# Patient Record
Sex: Female | Born: 1995 | Race: White | Hispanic: No | Smoking: Never smoker
Health system: Southern US, Community
[De-identification: ages and names within clinical notes are randomized; demographics above are authoritative.]

## PROBLEM LIST (undated history)

## (undated) DIAGNOSIS — F909 Attention-deficit hyperactivity disorder, unspecified type: Secondary | ICD-10-CM

## (undated) HISTORY — DX: Attention-deficit hyperactivity disorder, unspecified type: F90.9

---
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
## 2017-06-05 DIAGNOSIS — Z30017 Encounter for initial prescription of implantable subdermal contraceptive: Secondary | ICD-10-CM | POA: Diagnosis not present

## 2017-06-22 ENCOUNTER — Encounter: Payer: Self-pay | Admitting: Internal Medicine

## 2017-06-22 ENCOUNTER — Ambulatory Visit (INDEPENDENT_AMBULATORY_CARE_PROVIDER_SITE_OTHER): Payer: BLUE CROSS/BLUE SHIELD | Admitting: Internal Medicine

## 2017-06-22 VITALS — BP 94/68 | HR 60 | Temp 97.8°F | Wt 101.0 lb

## 2017-06-22 DIAGNOSIS — R197 Diarrhea, unspecified: Secondary | ICD-10-CM | POA: Diagnosis not present

## 2017-06-22 DIAGNOSIS — J029 Acute pharyngitis, unspecified: Secondary | ICD-10-CM | POA: Diagnosis not present

## 2017-06-22 DIAGNOSIS — J039 Acute tonsillitis, unspecified: Secondary | ICD-10-CM | POA: Diagnosis not present

## 2017-06-22 LAB — POCT RAPID STREP A (OFFICE): RAPID STREP A SCREEN: NEGATIVE

## 2017-06-22 MED ORDER — AZITHROMYCIN 250 MG PO TABS: ORAL_TABLET | ORAL | 0 refills | Status: DC

## 2017-06-22 NOTE — Progress Notes (Signed)
   Subjective:    Patient ID: Shelby SheriffHannah Watkins, female    DOB: 1996-02-22, 21 y.o.   MRN: [id number]  HPI  21 year old Female in with sore throat for 3 days. No documented fever. Has also had diarrhea for a few days.Sore throat is right sided. Has malaise and fatigue.  Continues to work at [employer]    Review of Systems see above     Objective:   Physical Exam She has a pustule on her right tonsil which is enlarged and slightly red. Left tonsil looks okay. No adenopathy. TMs are clear bilaterally. Neck is supple without adenopathy. Chest clear to auscultation.       Assessment & Plan:  Acute pharyngitis  Diarrhea-etiology unclear-could be viral  Plan: Rapid strep screen is negative. Treated with Zithromax to by mouth day 1 followed by 1 by mouth days 2 through 5. With regard to diarrhea, she is to stay on clear liquids until diarrhea has resolved for 24 hours then advance diet slowly.

## 2017-06-22 NOTE — Patient Instructions (Signed)
Take Zithromax Z-PAK as directed. Clear liquids until diarrhea has resolved for 24 hours then advance diet slowly. Tylenol if needed for pain

## 2017-07-29 DIAGNOSIS — H52223 Regular astigmatism, bilateral: Secondary | ICD-10-CM | POA: Diagnosis not present

## 2018-01-11 ENCOUNTER — Other Ambulatory Visit: Payer: Self-pay | Admitting: Internal Medicine

## 2018-01-11 DIAGNOSIS — R634 Abnormal weight loss: Secondary | ICD-10-CM

## 2018-01-13 ENCOUNTER — Other Ambulatory Visit (INDEPENDENT_AMBULATORY_CARE_PROVIDER_SITE_OTHER): Payer: BLUE CROSS/BLUE SHIELD | Admitting: Internal Medicine

## 2018-01-13 DIAGNOSIS — Z1329 Encounter for screening for other suspected endocrine disorder: Secondary | ICD-10-CM | POA: Diagnosis not present

## 2018-01-13 DIAGNOSIS — E559 Vitamin D deficiency, unspecified: Secondary | ICD-10-CM | POA: Diagnosis not present

## 2018-01-13 DIAGNOSIS — R634 Abnormal weight loss: Secondary | ICD-10-CM | POA: Diagnosis not present

## 2018-01-13 DIAGNOSIS — Z13220 Encounter for screening for lipoid disorders: Secondary | ICD-10-CM | POA: Diagnosis not present

## 2018-01-13 DIAGNOSIS — Z0000 Encounter for general adult medical examination without abnormal findings: Secondary | ICD-10-CM | POA: Diagnosis not present

## 2018-01-14 LAB — CBC WITH DIFFERENTIAL/PLATELET
BASOS PCT: 0.5 %
Basophils Absolute: 21 cells/uL (ref 0–200)
Eosinophils Absolute: 111 cells/uL (ref 15–500)
Eosinophils Relative: 2.7 %
HEMATOCRIT: 40.4 % (ref 35.0–45.0)
Hemoglobin: 13.5 g/dL (ref 11.7–15.5)
LYMPHS ABS: 1882 {cells}/uL (ref 850–3900)
MCH: 29.8 pg (ref 27.0–33.0)
MCHC: 33.4 g/dL (ref 32.0–36.0)
MCV: 89.2 fL (ref 80.0–100.0)
MPV: 10.3 fL (ref 7.5–12.5)
Monocytes Relative: 9.7 %
NEUTROS PCT: 41.2 %
Neutro Abs: 1689 cells/uL (ref 1500–7800)
Platelets: 280 10*3/uL (ref 140–400)
RBC: 4.53 10*6/uL (ref 3.80–5.10)
RDW: 12.3 % (ref 11.0–15.0)
Total Lymphocyte: 45.9 %
WBC: 4.1 10*3/uL (ref 3.8–10.8)
WBCMIX: 398 {cells}/uL (ref 200–950)

## 2018-01-14 LAB — TSH: TSH: 1.32 mIU/L

## 2018-01-14 LAB — LIPID PANEL
CHOL/HDL RATIO: 2.2 (calc) (ref <5.0)
Cholesterol: 132 mg/dL (ref <200)
HDL: 59 mg/dL (ref >50)
LDL CHOLESTEROL (CALC): 58 mg/dL
NON-HDL CHOLESTEROL (CALC): 73 mg/dL (ref <130)
Triglycerides: 70 mg/dL (ref <150)

## 2018-01-14 LAB — VITAMIN D 25 HYDROXY (VIT D DEFICIENCY, FRACTURES): Vit D, 25-Hydroxy: 26 ng/mL — ABNORMAL LOW (ref 30–100)

## 2018-03-03 IMAGING — XA DG FLUORO GUIDE NDL PLC/BX
2 series · 2 of 2 positions shown · non-contrast
Comparison: none

CLINICAL DATA: Right shoulder pain.

[Series 1: ortho adipose · 1 of 1 slices shown (1 of 2)]
[im 1/1]
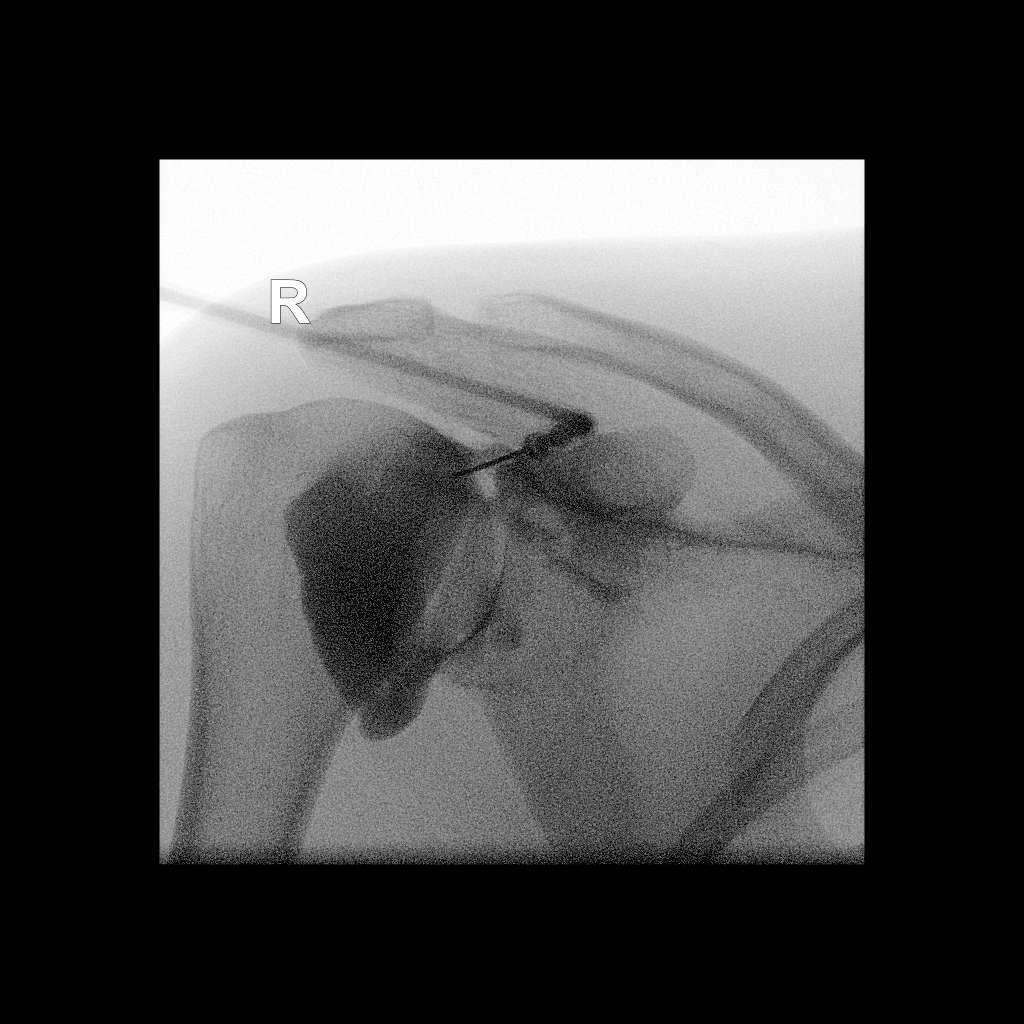

[Series 2: ortho adipose · 1 of 1 slices shown (2 of 2)]
[im 1/1]
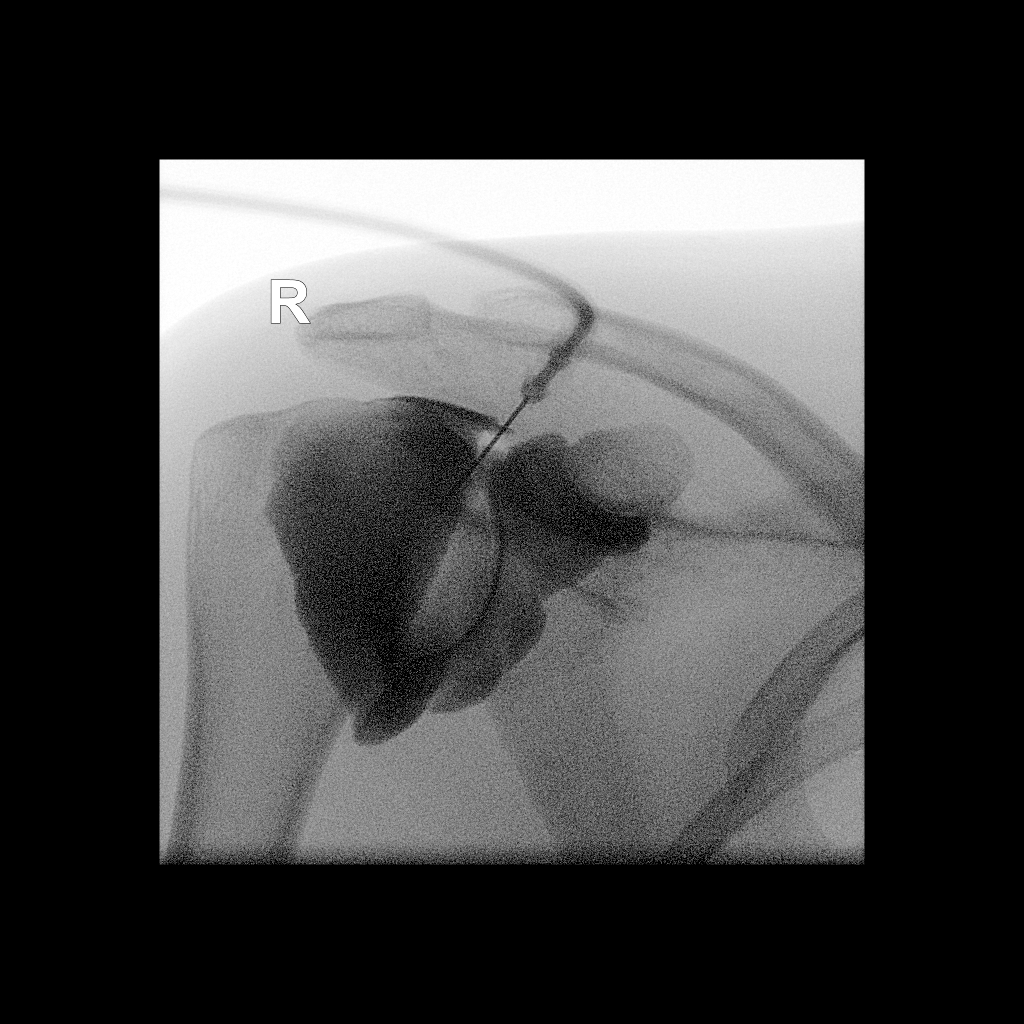

[2 of 2 positions shown; findings below may reference images not displayed]

FLUOROSCOPY TIME:  Radiation Exposure Index (as provided by the
fluoroscopic device): 3.48 microGray*m^2

Fluoroscopy Time (in minutes and seconds):  12 seconds

PROCEDURE:
RIGHT SHOULDER INJECTION UNDER FLUOROSCOPY

An appropriate skin entrance site was determined. The site was
marked, prepped with Betadine, draped in the usual sterile fashion,
and infiltrated locally with buffered Lidocaine. A 1.5 inch 22 gauge
spinal needle was advanced to the superomedial margin of the humeral
head under intermittent fluoroscopy. 1 ml of Lidocaine injected
easily. A mixture of 0.05 mL of MultiHance, 5 mL of 1% lidocaine,
and 15 mL of Isovue-M 200 was then used to opacify the right
shoulder capsule. 12 mL total of this mixture was injected. No
immediate complication.
IMPRESSION: Technically successful right shoulder injection for MRI.

## 2018-03-03 IMAGING — MR MR SHOULDER*R* W/CM
4 of 6 series · 18 of 40 positions shown · IV contrast (agent unspecified)
Comparison: None.

CLINICAL DATA: Right shoulder popping when rotating. Radiates down
the entire right arm.

EXAM:
MR ARTHROGRAM OF THE RIGHT SHOULDER
TECHNIQUE: Multiplanar, multisequence MR imaging of the right shoulder was
performed following the administration of intra-articular contrast.
CONTRAST:  See Injection Documentation.

[Series 3: T1 fat-sat · axial · 4.0mm · 0.22mm/px · z∈[-5,+73]mm · 5 of 22 slices shown (1 of 2)]
[im 1/22]
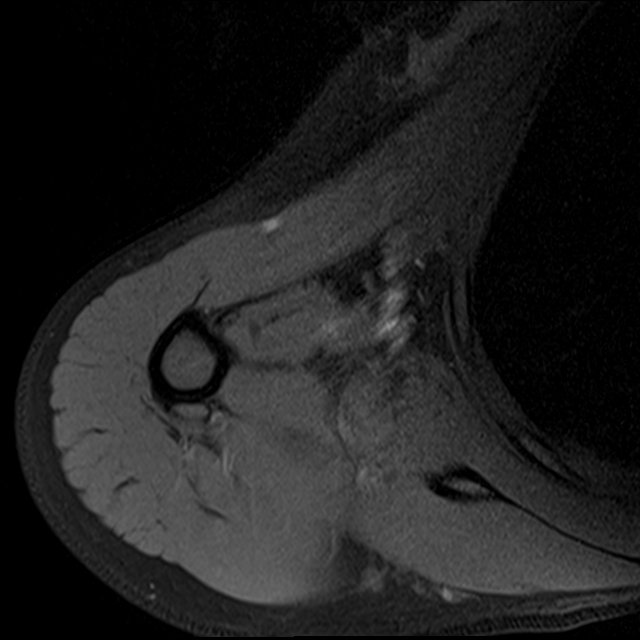
[im 3/22]
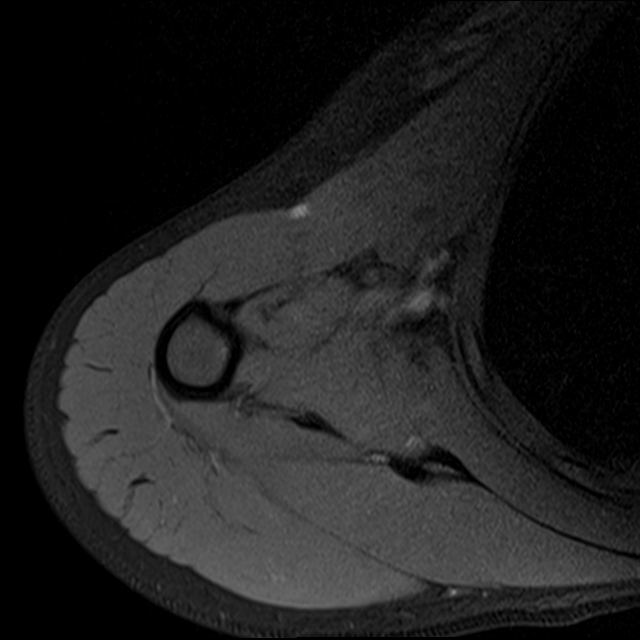
[im 6/22]
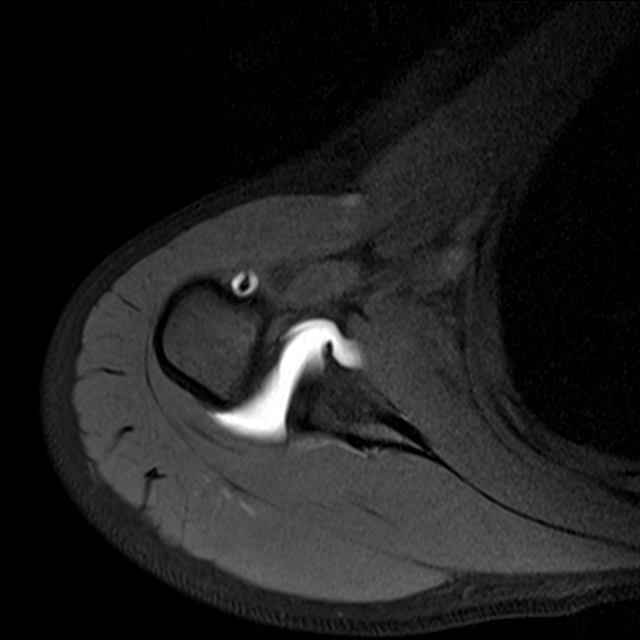
[im 11/22]
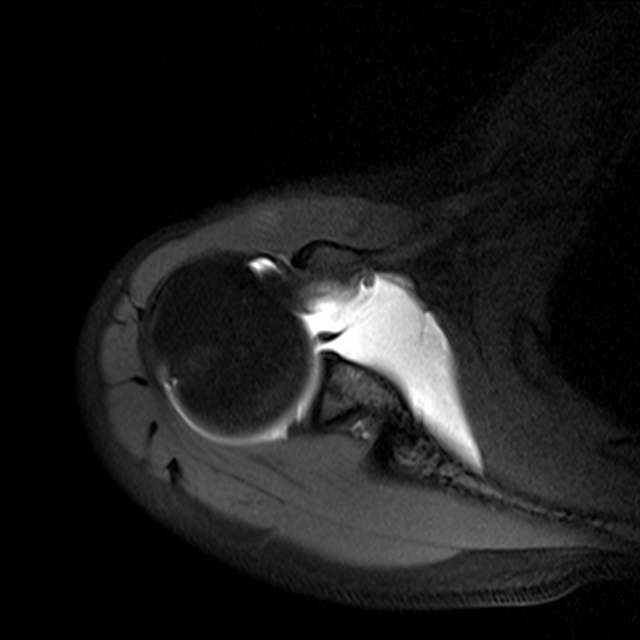
[im 19/22]
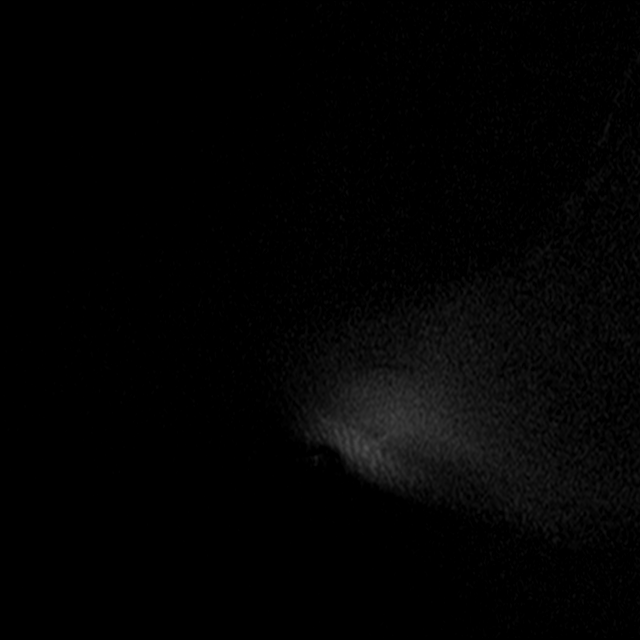

[Series 4: T1 fat-sat · oblique · 4.0mm · 0.22mm/px · 3 of 15 slices shown (2 of 2)]
[im 3/15]
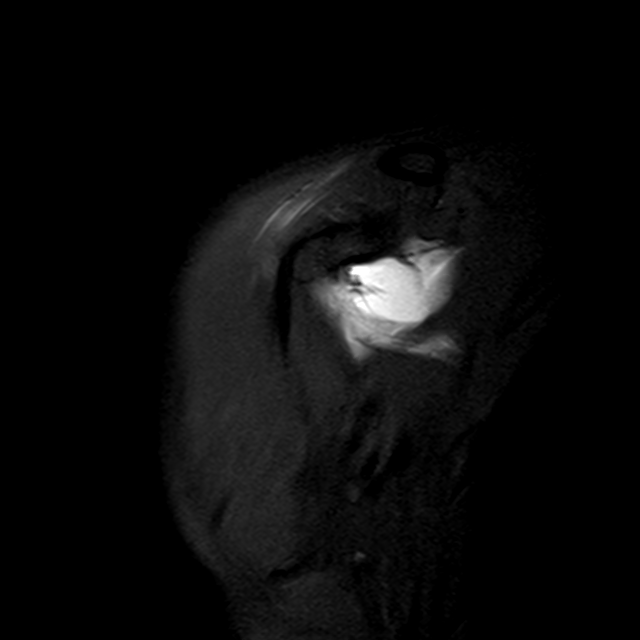
[im 9/15]
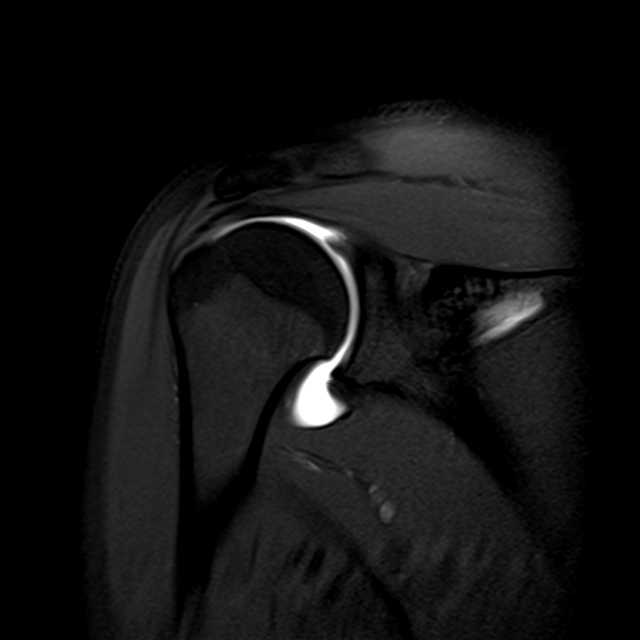
[im 15/15]
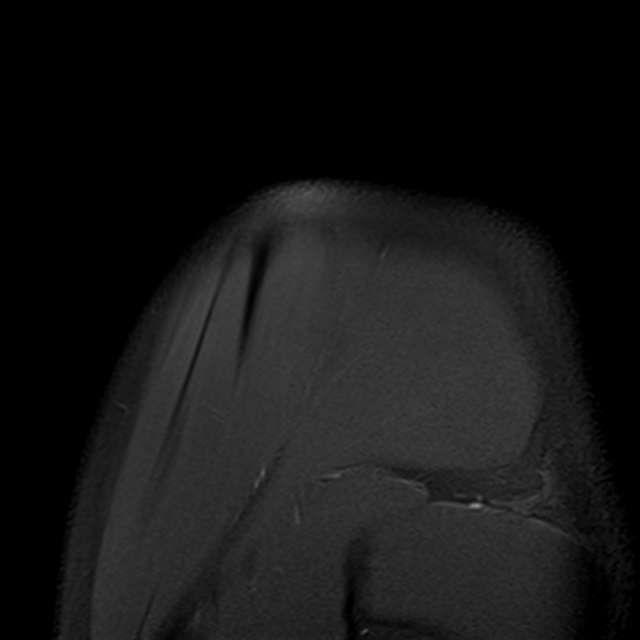

[Series 5: T1 · oblique · 4.0mm · 0.22mm/px · 3 of 15 slices shown]
[im 3/15]
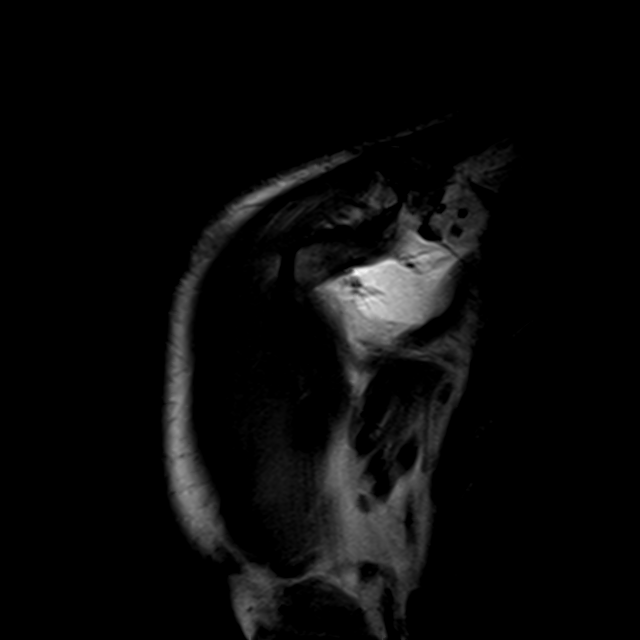
[im 9/15]
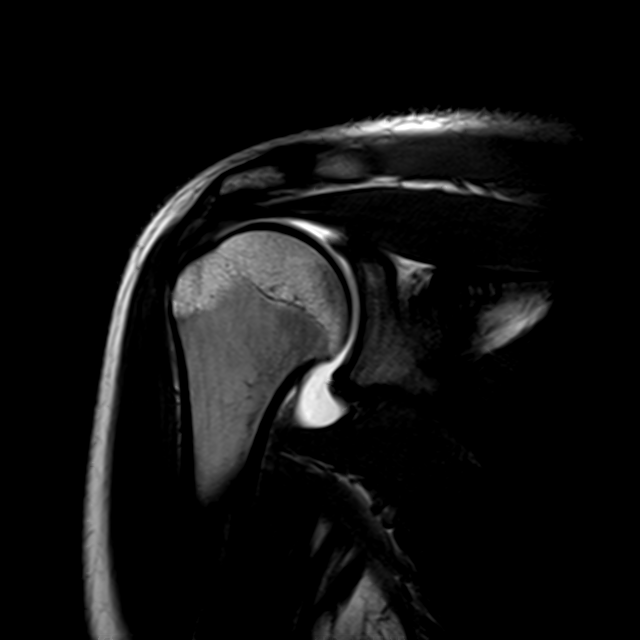
[im 15/15]
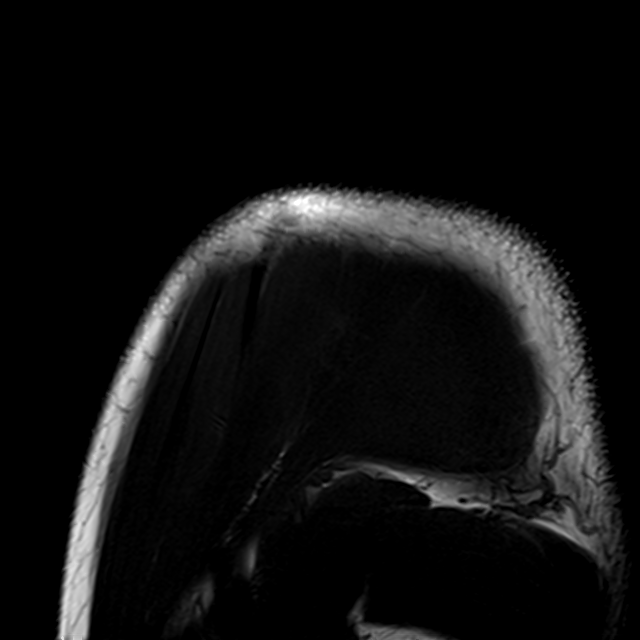

[Series 6: T2 fat-sat · oblique · 4.0mm · 0.44mm/px · 7 of 18 slices shown]
[im 1/18]
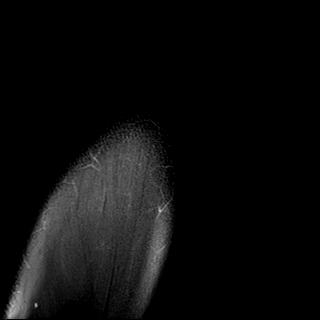
[im 3/18]
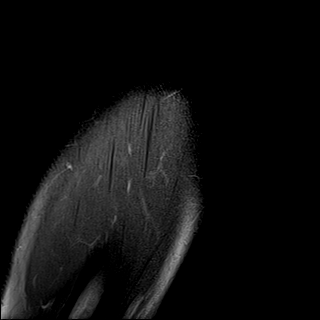
[im 6/18]
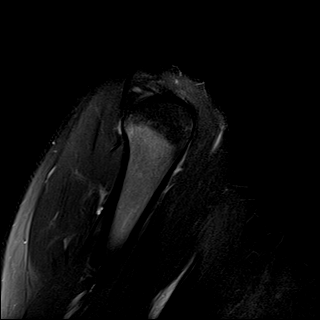
[im 9/18]
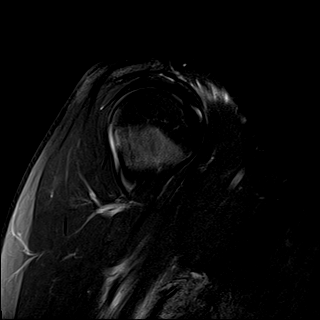
[im 12/18]
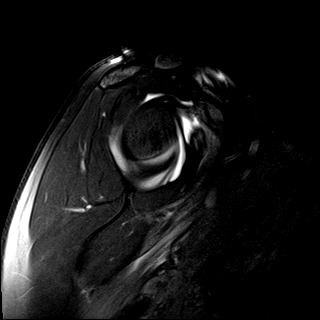
[im 15/18]
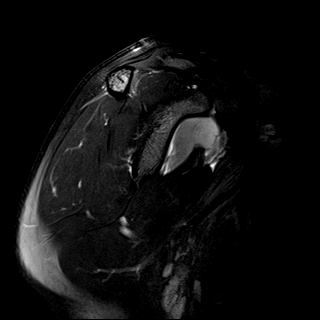
[im 18/18]
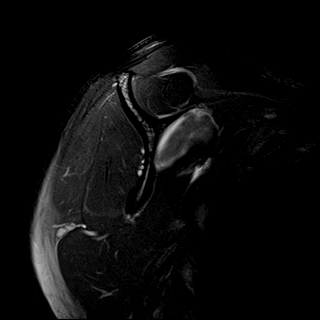

[18 of 40 positions shown; findings below may reference images not displayed]

FINDINGS: Rotator cuff: Mild tendinosis of the supraspinatus tendon without a
discrete tear. Infraspinatus tendon is intact. Teres minor tendon is
intact. Subscapularis tendon is intact.

Muscles: No atrophy or fatty replacement of nor abnormal signal
within, the muscles of the rotator cuff.

Biceps long head: Intact.

Acromioclavicular Joint: Normal acromioclavicular joint. Type I
acromion. No subacromial/subdeltoid bursal fluid.

Glenohumeral Joint: Intraarticular contrast distending the joint
capsule. Normal glenohumeral ligaments. No chondral defect.

Labrum: Intact.

Bones: No focal marrow signal abnormality. No fracture or
dislocation.
IMPRESSION: 1. Mild tendinosis of the supraspinatus tendon without a discrete
tear.
2. Intact labrum.

## 2018-03-03 IMAGING — MR MR KNEE*L* W/O CM
4 of 5 series · 19 of 40 positions shown · non-contrast
Comparison: 07/02/2011

CLINICAL DATA: Left knee pain. Left medial pain greater when
running or walking up the stairs.

EXAM:
MRI OF THE LEFT KNEE WITHOUT CONTRAST
TECHNIQUE: Multiplanar, multisequence MR imaging of the knee was performed. No
intravenous contrast was administered.

[Series 3: PD fat-sat · axial · 3.5mm · 0.31mm/px · z∈[-77,+14]mm · 7 of 23 slices shown (1 of 3)]
[im 1/23]
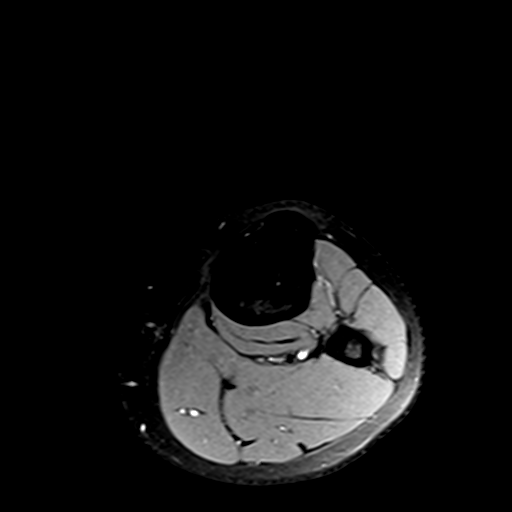
[im 4/23]
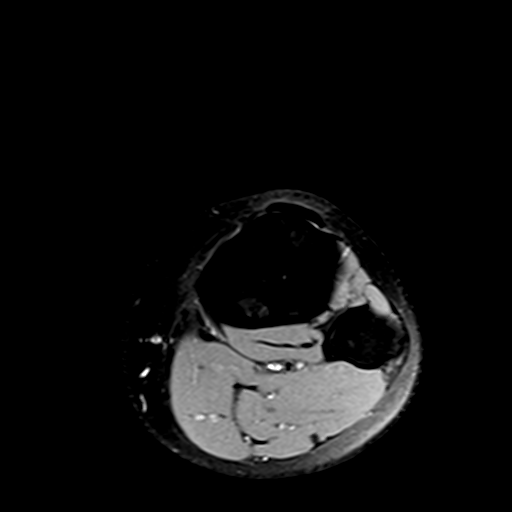
[im 8/23]
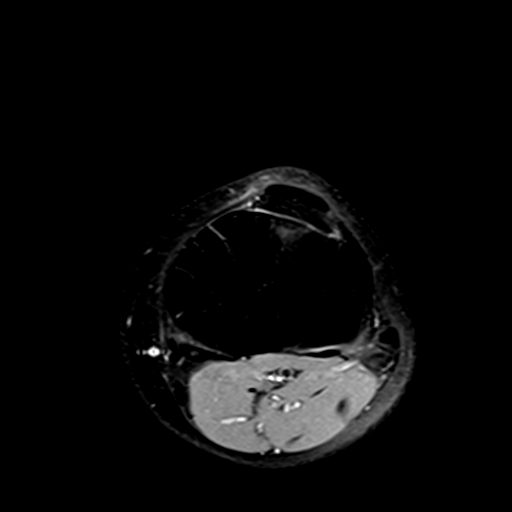
[im 12/23]
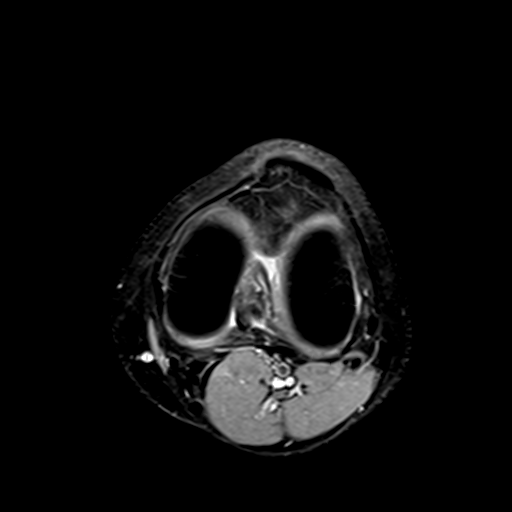
[im 15/23]
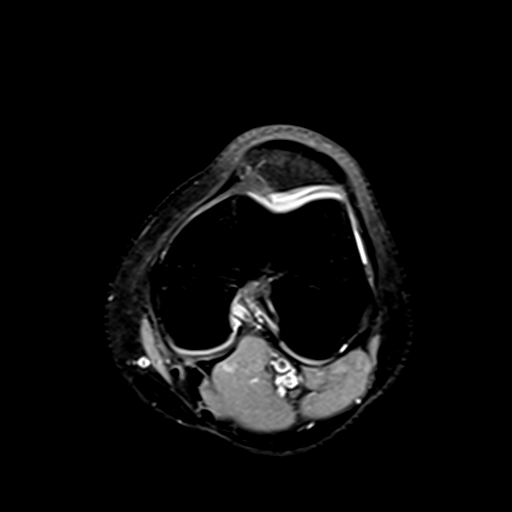
[im 19/23]
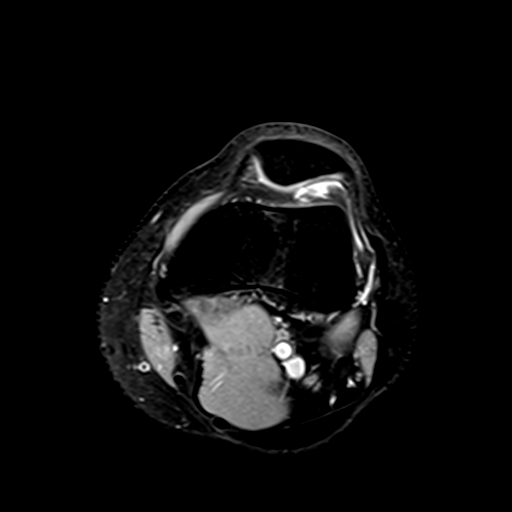
[im 23/23]
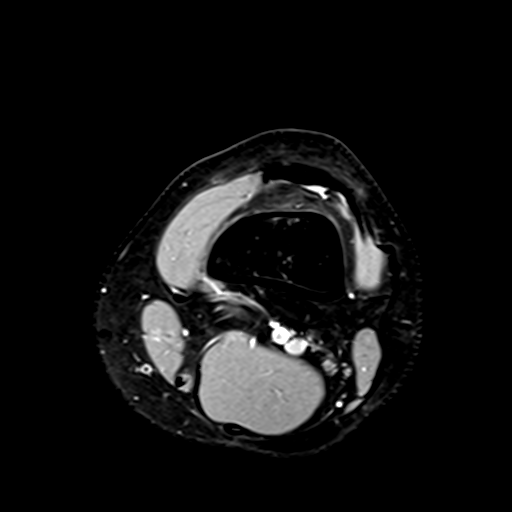

[Series 4: PD fat-sat · coronal · 3.5mm · 0.29mm/px · 6 of 21 slices shown (2 of 3)]
[im 1/21]
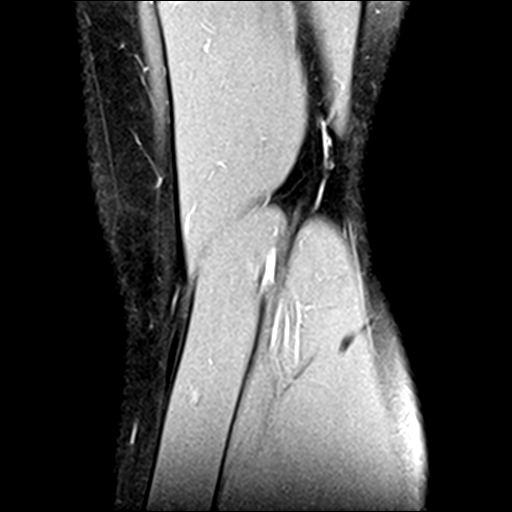
[im 3/21]
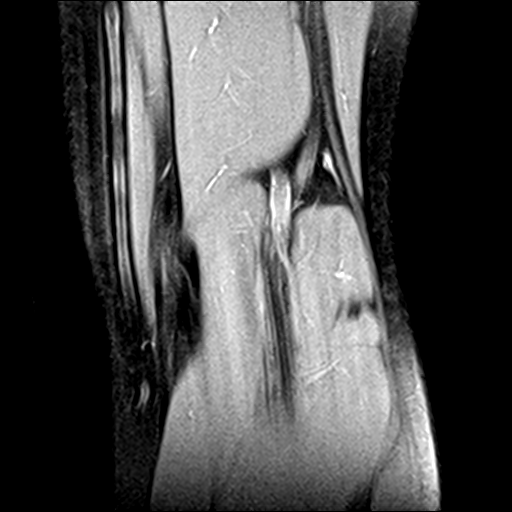
[im 6/21]
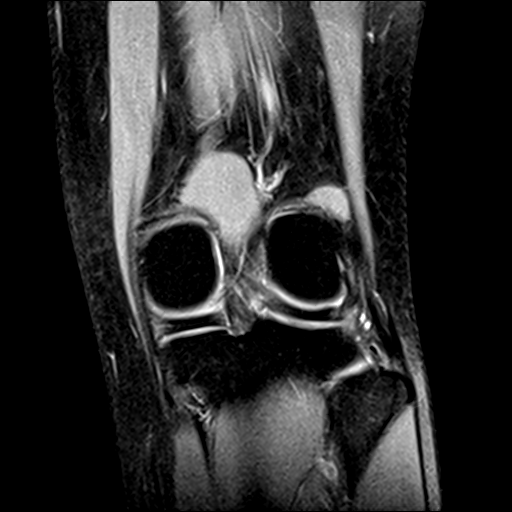
[im 9/21]
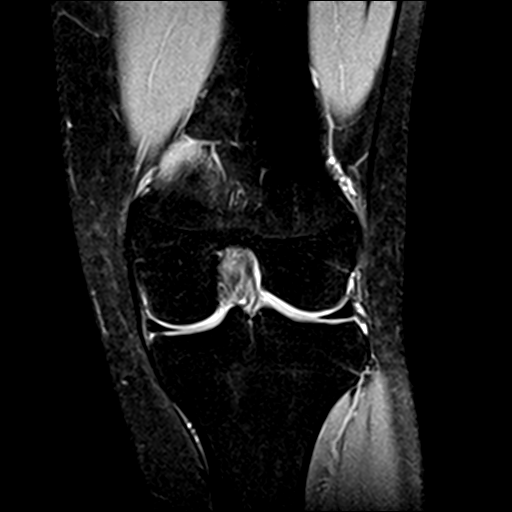
[im 12/21]
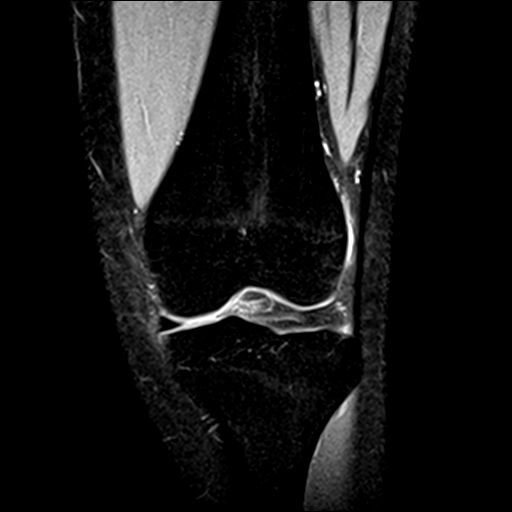
[im 18/21]
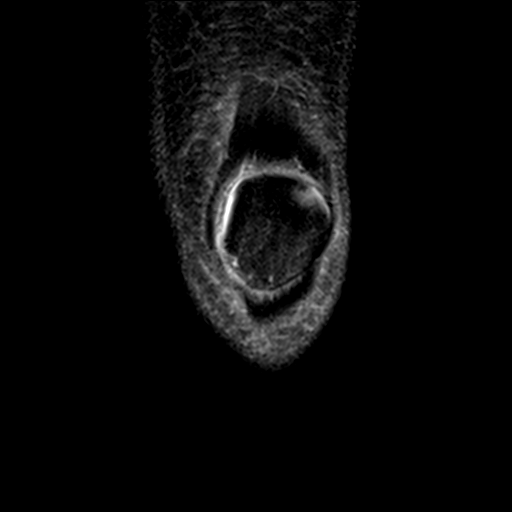

[Series 5: T2 fat-sat · coronal · 3.5mm · 0.29mm/px · 3 of 21 slices shown]
[im 3/21]
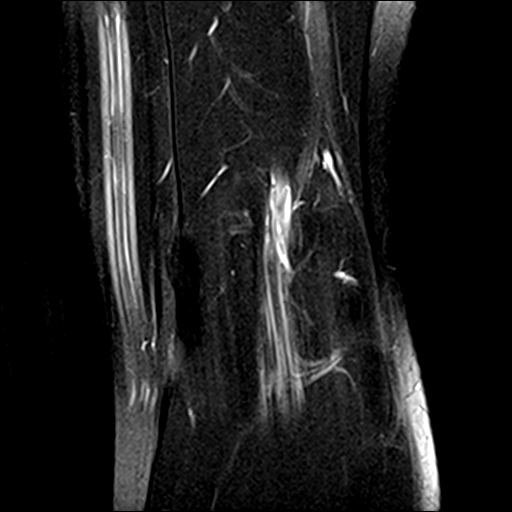
[im 12/21]
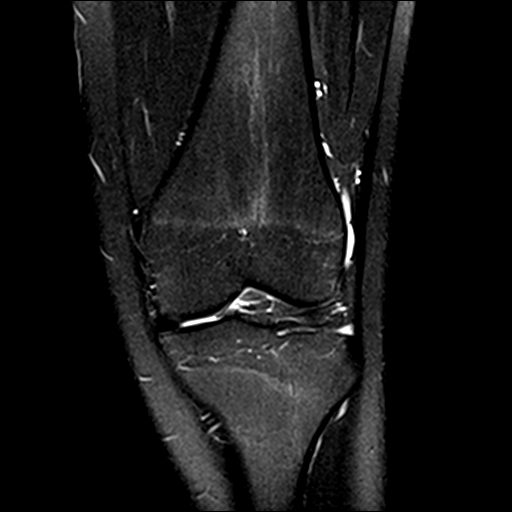
[im 18/21]
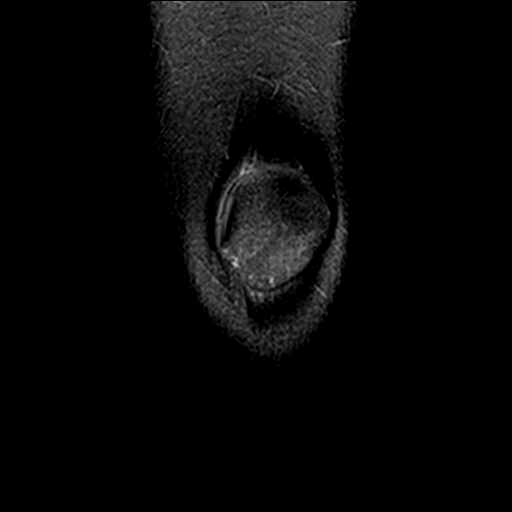

[Series 7: PD fat-sat · sagittal · 3.2mm · 0.29mm/px · 3 of 24 slices shown (3 of 3)]
[im 3/24]
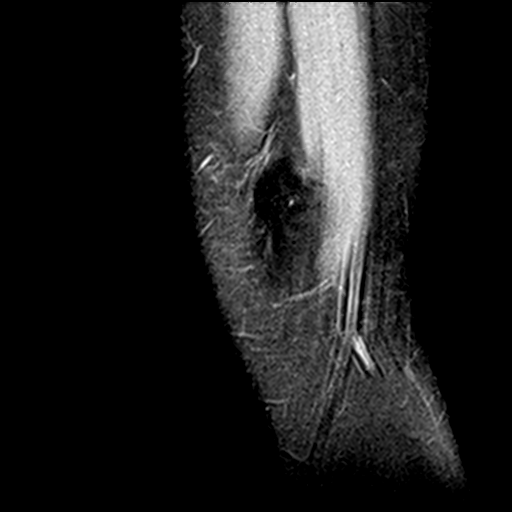
[im 12/24]
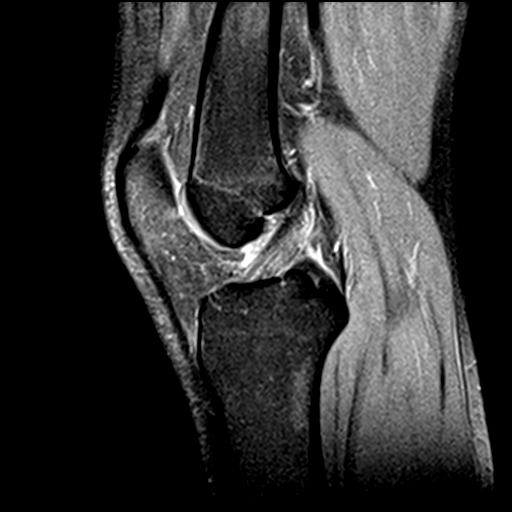
[im 21/24]
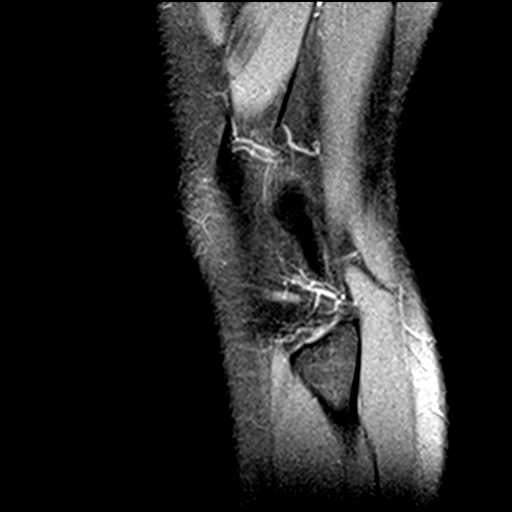

[19 of 40 positions shown; findings below may reference images not displayed]

FINDINGS: MENISCI

Medial meniscus:  Intact.

Lateral meniscus:  Intact.

LIGAMENTS

Cruciates:  Intact ACL and PCL.

Collaterals: Medial collateral ligament is intact. Lateral
collateral ligament complex is intact.

CARTILAGE

Patellofemoral:  No chondral defect.

Medial:  No chondral defect.

Lateral:  No chondral defect.

Joint: No joint effusion. No plical thickening. Normal Hoffa's fat.

Popliteal Fossa:  No Baker cyst.  Intact popliteus tendon.

Extensor Mechanism:  Intact.

Bones: No focal marrow signal abnormality. No acute fracture or
dislocation.
IMPRESSION: 1. No internal derangement of the left knee.

## 2018-03-25 ENCOUNTER — Encounter: Payer: BLUE CROSS/BLUE SHIELD | Admitting: Internal Medicine

## 2018-07-30 ENCOUNTER — Other Ambulatory Visit: Payer: Self-pay | Admitting: Internal Medicine

## 2018-07-30 DIAGNOSIS — E559 Vitamin D deficiency, unspecified: Secondary | ICD-10-CM

## 2018-07-30 DIAGNOSIS — Z0000 Encounter for general adult medical examination without abnormal findings: Secondary | ICD-10-CM

## 2018-07-30 DIAGNOSIS — Z1329 Encounter for screening for other suspected endocrine disorder: Secondary | ICD-10-CM

## 2018-07-30 DIAGNOSIS — Z13220 Encounter for screening for lipoid disorders: Secondary | ICD-10-CM

## 2018-08-03 ENCOUNTER — Other Ambulatory Visit: Payer: BLUE CROSS/BLUE SHIELD | Admitting: Internal Medicine

## 2018-08-03 NOTE — Addendum Note (Signed)
Addended by: Tawnya Crook on: 08/03/2018 09:31 AM   Modules accepted: Orders

## 2018-08-06 ENCOUNTER — Encounter: Payer: BLUE CROSS/BLUE SHIELD | Admitting: Internal Medicine

## 2018-08-11 ENCOUNTER — Other Ambulatory Visit: Payer: Self-pay | Admitting: Internal Medicine

## 2018-08-11 DIAGNOSIS — Z1329 Encounter for screening for other suspected endocrine disorder: Secondary | ICD-10-CM

## 2018-08-11 DIAGNOSIS — Z0000 Encounter for general adult medical examination without abnormal findings: Secondary | ICD-10-CM

## 2018-08-11 DIAGNOSIS — Z1321 Encounter for screening for nutritional disorder: Secondary | ICD-10-CM

## 2018-08-11 DIAGNOSIS — Z13220 Encounter for screening for lipoid disorders: Secondary | ICD-10-CM

## 2018-08-16 ENCOUNTER — Other Ambulatory Visit: Payer: BLUE CROSS/BLUE SHIELD | Admitting: Internal Medicine

## 2018-08-16 DIAGNOSIS — E559 Vitamin D deficiency, unspecified: Secondary | ICD-10-CM | POA: Diagnosis not present

## 2018-08-16 DIAGNOSIS — Z1329 Encounter for screening for other suspected endocrine disorder: Secondary | ICD-10-CM | POA: Diagnosis not present

## 2018-08-16 DIAGNOSIS — Z13220 Encounter for screening for lipoid disorders: Secondary | ICD-10-CM | POA: Diagnosis not present

## 2018-08-16 DIAGNOSIS — Z0000 Encounter for general adult medical examination without abnormal findings: Secondary | ICD-10-CM | POA: Diagnosis not present

## 2018-08-17 LAB — CBC WITH DIFFERENTIAL/PLATELET
Basophils Absolute: 18 cells/uL (ref 0–200)
Basophils Relative: 0.4 %
EOS PCT: 2.2 %
Eosinophils Absolute: 99 cells/uL (ref 15–500)
HEMATOCRIT: 38 % (ref 35.0–45.0)
Hemoglobin: 12.8 g/dL (ref 11.7–15.5)
LYMPHS ABS: 1607 {cells}/uL (ref 850–3900)
MCH: 30.3 pg (ref 27.0–33.0)
MCHC: 33.7 g/dL (ref 32.0–36.0)
MCV: 90 fL (ref 80.0–100.0)
MPV: 10.4 fL (ref 7.5–12.5)
Monocytes Relative: 7.3 %
Neutro Abs: 2448 cells/uL (ref 1500–7800)
Neutrophils Relative %: 54.4 %
PLATELETS: 297 10*3/uL (ref 140–400)
RBC: 4.22 10*6/uL (ref 3.80–5.10)
RDW: 12.3 % (ref 11.0–15.0)
TOTAL LYMPHOCYTE: 35.7 %
WBC: 4.5 10*3/uL (ref 3.8–10.8)
WBCMIX: 329 {cells}/uL (ref 200–950)

## 2018-08-17 LAB — VITAMIN D 25 HYDROXY (VIT D DEFICIENCY, FRACTURES): VIT D 25 HYDROXY: 27 ng/mL — AB (ref 30–100)

## 2018-08-17 LAB — LIPID PANEL
Cholesterol: 128 mg/dL (ref <200)
HDL: 62 mg/dL (ref >50)
LDL CHOLESTEROL (CALC): 51 mg/dL
Non-HDL Cholesterol (Calc): 66 mg/dL (calc) (ref <130)
Total CHOL/HDL Ratio: 2.1 (calc) (ref <5.0)
Triglycerides: 66 mg/dL (ref <150)

## 2018-08-17 LAB — COMPLETE METABOLIC PANEL WITH GFR
AG Ratio: 1.5 (calc) (ref 1.0–2.5)
ALKALINE PHOSPHATASE (APISO): 54 U/L (ref 33–115)
ALT: 12 U/L (ref 6–29)
AST: 16 U/L (ref 10–30)
Albumin: 4.3 g/dL (ref 3.6–5.1)
BUN: 8 mg/dL (ref 7–25)
CO2: 26 mmol/L (ref 20–32)
CREATININE: 0.57 mg/dL (ref 0.50–1.10)
Calcium: 9.7 mg/dL (ref 8.6–10.2)
Chloride: 106 mmol/L (ref 98–110)
GFR, Est African American: 155 mL/min/{1.73_m2} (ref >=60)
GFR, Est Non African American: 133 mL/min/{1.73_m2} (ref >=60)
GLUCOSE: 85 mg/dL (ref 65–99)
Globulin: 2.9 g/dL (calc) (ref 1.9–3.7)
Potassium: 4.8 mmol/L (ref 3.5–5.3)
Sodium: 139 mmol/L (ref 135–146)
Total Bilirubin: 0.4 mg/dL (ref 0.2–1.2)
Total Protein: 7.2 g/dL (ref 6.1–8.1)

## 2018-08-17 LAB — TSH: TSH: 1.05 m[IU]/L

## 2018-08-18 ENCOUNTER — Ambulatory Visit (INDEPENDENT_AMBULATORY_CARE_PROVIDER_SITE_OTHER): Payer: BLUE CROSS/BLUE SHIELD | Admitting: Internal Medicine

## 2018-08-18 ENCOUNTER — Encounter: Payer: Self-pay | Admitting: Internal Medicine

## 2018-08-18 VITALS — BP 96/60 | HR 68 | Temp 98.2°F | Ht 59.0 in | Wt 96.0 lb

## 2018-08-18 DIAGNOSIS — Z23 Encounter for immunization: Secondary | ICD-10-CM | POA: Diagnosis not present

## 2018-08-18 DIAGNOSIS — E559 Vitamin D deficiency, unspecified: Secondary | ICD-10-CM | POA: Diagnosis not present

## 2018-08-18 DIAGNOSIS — F909 Attention-deficit hyperactivity disorder, unspecified type: Secondary | ICD-10-CM | POA: Diagnosis not present

## 2018-08-18 DIAGNOSIS — Z0000 Encounter for general adult medical examination without abnormal findings: Secondary | ICD-10-CM | POA: Diagnosis not present

## 2018-08-18 LAB — POCT URINALYSIS DIPSTICK
Appearance: NORMAL
BILIRUBIN UA: NEGATIVE
Blood, UA: NEGATIVE
Glucose, UA: NEGATIVE
Ketones, UA: NEGATIVE
Leukocytes, UA: NEGATIVE
Nitrite, UA: NEGATIVE
ODOR: NORMAL
PROTEIN UA: NEGATIVE
Spec Grav, UA: 1.015 (ref 1.010–1.025)
Urobilinogen, UA: 0.2 E.U./dL
pH, UA: 6 (ref 5.0–8.0)

## 2018-08-18 NOTE — Progress Notes (Signed)
   Subjective:    Patient ID: Shelby Watkins, female    DOB: 08-31-1996, 22 y.o.   MRN: [id number]  HPI   22 year old Female for health maintenance exam and evaluation of medical issues.   She was first seen here in 2016.  Is seen here infrequently.  She came to this country from [place] and [place].  She was adopted by Dr. Erick Blinks and her husband.  No known drug allergies.  History of attention deficit issues treated with Vyvanse.  She takes melatonin for sleep if needed.  Has GYN physician.  She is sexually active.  Has boyfriend.  Says she uses condoms.  No history of serious illnesses accidents or operations.  Family history unknown because she was adopted from [place].  Social history: Previously attended [employer].  Prior to that attended boarding school for girls in [place] and before that was in a wilderness camp for behavioral issues.  She does smoke marijuana sometimes.  Has been working as a Research scientist (medical).  History of tonsillitis in 2016 and 2018 treated with Zithromax.  Vitamin D level is low at 27.  Recommend 2000 units vitamin D3 daily.  Review of Systems  Cardiovascular: Negative.   Gastrointestinal: Negative.   Genitourinary: Negative.   Musculoskeletal: Negative.        Objective:   Physical Exam  Constitutional: She is oriented to person, place, and time. She appears well-developed and well-nourished. No distress.  HENT:  Head: Normocephalic and atraumatic.  Right Ear: External ear normal.  Left Ear: External ear normal.  Mouth/Throat: Oropharynx is clear and moist. No oropharyngeal exudate.  Eyes: Pupils are equal, round, and reactive to light. Conjunctivae are normal. Right eye exhibits no discharge. Left eye exhibits no discharge.  Neck: No JVD present. No tracheal deviation present. No thyromegaly present.  Cardiovascular: Normal rate, regular rhythm, normal heart sounds and intact distal pulses.  No murmur  heard. Pulmonary/Chest: No stridor. No respiratory distress. She has no wheezes.  Abdominal: Soft. Bowel sounds are normal. She exhibits no distension and no mass. There is no tenderness. There is no rebound and no guarding.  Genitourinary:  Genitourinary Comments: Deferred to GYN  Musculoskeletal: She exhibits no edema.  Lymphadenopathy:    She has no cervical adenopathy.  Neurological: She is alert and oriented to person, place, and time. She displays normal reflexes. No cranial nerve deficit. Coordination normal.  Skin: Skin is warm and dry. No rash noted. She is not diaphoretic.  Psychiatric: She has a normal mood and affect. Her behavior is normal. Judgment and thought content normal.  Vitals reviewed.         Assessment & Plan:  Health maintenance exam  Vitamin D deficiency-take 2000 units vitamin D3 daily  History of attention deficit disorder treated with Vyvanse.  Influenza vaccine given and tetanus update given.  Plan: Return in 1 year or as needed.

## 2018-09-10 DIAGNOSIS — F988 Other specified behavioral and emotional disorders with onset usually occurring in childhood and adolescence: Secondary | ICD-10-CM | POA: Insufficient documentation

## 2018-09-10 DIAGNOSIS — Z0282 Encounter for adoption services: Secondary | ICD-10-CM | POA: Insufficient documentation

## 2018-09-10 NOTE — Patient Instructions (Addendum)
Take 2000 units vitamin D3 daily.  Flu vaccine given.  Tetanus immunization update given.

## 2018-09-30 ENCOUNTER — Encounter: Payer: Self-pay | Admitting: Internal Medicine

## 2018-09-30 ENCOUNTER — Ambulatory Visit: Payer: BLUE CROSS/BLUE SHIELD | Admitting: Internal Medicine

## 2018-09-30 VITALS — BP 110/80 | Temp 98.1°F | Ht 59.0 in | Wt 95.0 lb

## 2018-09-30 DIAGNOSIS — R059 Cough, unspecified: Secondary | ICD-10-CM

## 2018-09-30 DIAGNOSIS — B2790 Infectious mononucleosis, unspecified without complication: Secondary | ICD-10-CM

## 2018-09-30 DIAGNOSIS — J039 Acute tonsillitis, unspecified: Secondary | ICD-10-CM

## 2018-09-30 DIAGNOSIS — E869 Volume depletion, unspecified: Secondary | ICD-10-CM

## 2018-09-30 DIAGNOSIS — J029 Acute pharyngitis, unspecified: Secondary | ICD-10-CM

## 2018-09-30 DIAGNOSIS — R05 Cough: Secondary | ICD-10-CM | POA: Diagnosis not present

## 2018-09-30 DIAGNOSIS — I951 Orthostatic hypotension: Secondary | ICD-10-CM

## 2018-09-30 LAB — POCT RAPID STREP A (OFFICE): RAPID STREP A SCREEN: NEGATIVE

## 2018-09-30 MED ORDER — CEFTRIAXONE SODIUM 1 G IJ SOLR
1.0000 g | Freq: Once | INTRAMUSCULAR | Status: AC
  Administered 2018-09-30: 1 g via INTRAMUSCULAR

## 2018-09-30 MED ORDER — DOXYCYCLINE HYCLATE 100 MG PO TABS: 100.0000 mg | ORAL_TABLET | Freq: Two times a day (BID) | ORAL | 0 refills | Status: DC

## 2018-09-30 MED ORDER — ESCITALOPRAM OXALATE 10 MG PO TABS: 10.0000 mg | ORAL_TABLET | Freq: Every day | ORAL | 1 refills | Status: DC

## 2018-09-30 NOTE — Progress Notes (Signed)
   Subjective:    Patient ID: Shelby Watkins, female    DOB: 1996-08-11, 22 y.o.   MRN: [id number]  HPI 22 year old Female in with 2 week history of sore throat and cough.  Has malaise and fatigue.  Accompanied by mother.  Mother is afraid she is not eating and drinking very much.  Mother noted exudate on right tonsil with enlargement of tonsil.  Currently working as a Research scientist (medical)dog groomer.  Lives in an apartment alone on same property as where her parents reside.  Plans to do some dog sitting over the weekend.  Says she will not overexert herself.       Review of Systems has had nausea and some vomiting.  No diarrhea.  Has complained of some abdominal pain     Objective:   Physical Exam Vital signs reviewed.  Blood pressure lying is 100/70 with pulse of 68 standing blood pressure is 80/60.  Her mouth is moist.  She has an exudate on right tonsil which is enlarged and red.  Left tonsil is red but not nearly as large as the right tonsil.  Neck is supple.  Chest clear to auscultation.  Abdomen no hepatosplenomegaly masses or tenderness appreciated.       Assessment & Plan:  Orthostatic hypotension likely secondary to volume depletion  Exudative pharyngitis-have drawn CBC and Monospot test.  Rapid strep test is negative.  Plan: Will treat with doxycycline 100 mg twice daily for 10 days.  Zofran for nausea.  Rest and drink plenty of fluids.  We discussed IV fluid hydration but were going to try to hold off on that for now.  Addendum: Monospot test is positive.  Mother was informed of results of CBC which is normal and positive Monospot test.  Mother who is a physician will monitor her closely and call me if any concerns.

## 2018-10-01 LAB — CBC WITH DIFFERENTIAL/PLATELET
ABSOLUTE MONOCYTES: 440 {cells}/uL (ref 200–950)
Basophils Absolute: 22 cells/uL (ref 0–200)
Basophils Relative: 0.4 %
Eosinophils Absolute: 11 cells/uL — ABNORMAL LOW (ref 15–500)
Eosinophils Relative: 0.2 %
HEMATOCRIT: 36.6 % (ref 35.0–45.0)
HEMOGLOBIN: 12.5 g/dL (ref 11.7–15.5)
Lymphs Abs: 3696 cells/uL (ref 850–3900)
MCH: 29.8 pg (ref 27.0–33.0)
MCHC: 34.2 g/dL (ref 32.0–36.0)
MCV: 87.4 fL (ref 80.0–100.0)
MPV: 11.1 fL (ref 7.5–12.5)
Monocytes Relative: 8 %
Neutro Abs: 1331 cells/uL — ABNORMAL LOW (ref 1500–7800)
Neutrophils Relative %: 24.2 %
Platelets: 153 10*3/uL (ref 140–400)
RBC: 4.19 10*6/uL (ref 3.80–5.10)
RDW: 12.7 % (ref 11.0–15.0)
Total Lymphocyte: 67.2 %
WBC: 5.5 10*3/uL (ref 3.8–10.8)

## 2018-10-01 LAB — MONONUCLEOSIS SCREEN: Heterophile, Mono Screen: POSITIVE — AB

## 2018-10-04 ENCOUNTER — Encounter: Payer: Self-pay | Admitting: Internal Medicine

## 2018-10-04 ENCOUNTER — Ambulatory Visit: Payer: BLUE CROSS/BLUE SHIELD | Admitting: Internal Medicine

## 2018-10-04 ENCOUNTER — Telehealth: Payer: Self-pay | Admitting: Internal Medicine

## 2018-10-04 VITALS — BP 80/60 | HR 84 | Temp 98.1°F | Ht 59.0 in | Wt 94.0 lb

## 2018-10-04 DIAGNOSIS — R1110 Vomiting, unspecified: Secondary | ICD-10-CM | POA: Diagnosis not present

## 2018-10-04 DIAGNOSIS — I951 Orthostatic hypotension: Secondary | ICD-10-CM | POA: Diagnosis not present

## 2018-10-04 DIAGNOSIS — E869 Volume depletion, unspecified: Secondary | ICD-10-CM

## 2018-10-04 DIAGNOSIS — R109 Unspecified abdominal pain: Secondary | ICD-10-CM | POA: Diagnosis not present

## 2018-10-04 DIAGNOSIS — B2799 Infectious mononucleosis, unspecified with other complication: Secondary | ICD-10-CM

## 2018-10-04 DIAGNOSIS — B178 Other specified acute viral hepatitis: Secondary | ICD-10-CM

## 2018-10-04 LAB — POCT URINALYSIS DIPSTICK
Bilirubin, UA: POSITIVE
Blood, UA: NEGATIVE
Glucose, UA: NEGATIVE
KETONES UA: NEGATIVE
Leukocytes, UA: NEGATIVE
NITRITE UA: NEGATIVE
PH UA: 6.5 (ref 5.0–8.0)
PROTEIN UA: NEGATIVE
Spec Grav, UA: 1.01 (ref 1.010–1.025)
UROBILINOGEN UA: 2 U/dL — AB

## 2018-10-04 LAB — COMPLETE METABOLIC PANEL WITH GFR
AG Ratio: 1.2 (calc) (ref 1.0–2.5)
ALT: 968 U/L — AB (ref 6–29)
AST: 648 U/L — ABNORMAL HIGH (ref 10–30)
Albumin: 3.9 g/dL (ref 3.6–5.1)
Alkaline phosphatase (APISO): 270 U/L — ABNORMAL HIGH (ref 33–115)
BUN: 7 mg/dL (ref 7–25)
CO2: 30 mmol/L (ref 20–32)
Calcium: 9.7 mg/dL (ref 8.6–10.2)
Chloride: 103 mmol/L (ref 98–110)
Creat: 0.6 mg/dL (ref 0.50–1.10)
GFR, Est African American: 152 mL/min/{1.73_m2} (ref >=60)
GFR, Est Non African American: 131 mL/min/{1.73_m2} (ref >=60)
Globulin: 3.2 g/dL (calc) (ref 1.9–3.7)
Glucose, Bld: 97 mg/dL (ref 65–99)
Potassium: 4 mmol/L (ref 3.5–5.3)
Sodium: 138 mmol/L (ref 135–146)
Total Bilirubin: 0.8 mg/dL (ref 0.2–1.2)
Total Protein: 7.1 g/dL (ref 6.1–8.1)

## 2018-10-04 LAB — CBC WITH DIFFERENTIAL/PLATELET
Absolute Monocytes: 456 cells/uL (ref 200–950)
Basophils Absolute: 176 cells/uL (ref 0–200)
Basophils Relative: 1.5 %
Eosinophils Absolute: 23 cells/uL (ref 15–500)
Eosinophils Relative: 0.2 %
HCT: 36.7 % (ref 35.0–45.0)
Hemoglobin: 12.9 g/dL (ref 11.7–15.5)
Lymphs Abs: 9559 cells/uL — ABNORMAL HIGH (ref 850–3900)
MCH: 31.1 pg (ref 27.0–33.0)
MCHC: 35.1 g/dL (ref 32.0–36.0)
MCV: 88.4 fL (ref 80.0–100.0)
MPV: 10.8 fL (ref 7.5–12.5)
Monocytes Relative: 3.9 %
NEUTROS ABS: 1486 {cells}/uL — AB (ref 1500–7800)
Neutrophils Relative %: 12.7 %
PLATELETS: 227 10*3/uL (ref 140–400)
RBC: 4.15 10*6/uL (ref 3.80–5.10)
RDW: 12.9 % (ref 11.0–15.0)
Total Lymphocyte: 81.7 %
WBC: 11.7 10*3/uL — ABNORMAL HIGH (ref 3.8–10.8)

## 2018-10-04 MED ORDER — AZITHROMYCIN 250 MG PO TABS: ORAL_TABLET | ORAL | 0 refills | Status: DC

## 2018-10-04 NOTE — Progress Notes (Signed)
   Subjective:    Patient ID: Shelby Watkins, female    DOB: November 10, 1995, 22 y.o.   MRN: [id number]  HPI 22 year old Female recently diagnosed with mononucleosis in today with complaint of abdominal pain.  Has felt nauseated.  We prescribe Zofran for the nausea.  Mother is concerned that she is not consuming enough liquids to stay well-hydrated.  She has been lethargic and fatigued.    Review of Systems hard to get information from her.  Basically answers yes and no to questions.  Complaining of some vague abdominal pain.  Has had some nausea.  Continues to have sore throat.     Objective:   Physical Exam Blood pressure lying is 100/60 with standing 80/60.  Still has exudate and enlargement of right tonsil.  Neck is supple.  Chest clear to auscultation without rales or wheezing.  Abdomen is soft nondistended today without significant masses or tenderness to my exam.       Assessment & Plan:  Volume depletion  Orthostasis-likely due to volume depletion  Mononucleosis  Abdominal pain  Hepatitis associated with mononucleosis  Plan: Her urine has an orange tint to it.  Will be sent to the lab for evaluation.  Likely has hepatitis associated with mononucleosis.  In addition she has orthostatic hypotension.  Talked with patient and her mother about the importance of staying well-hydrated and trying to eat something.  She was not able to tolerate doxycycline.  Hold off on antibiotics for now.  Not coughing much.  Still has sore throat.  Looks fatigued.  Addendum: Called mother at 318 PM with results of labs.  White count 11,700 with lymphocytosis.  Alkaline phosphatase 270, SGOT 648 and SGPT 968.  Recommend close observation and follow-up on Thursday, December 26.

## 2018-10-04 NOTE — Telephone Encounter (Signed)
Coming this afternoon after speaking with her mother, Dr. Leanor RubensteinHaber

## 2018-10-04 NOTE — Telephone Encounter (Signed)
Mother called stating patient is not doing well r/t the Mono.  States she is having abdominal pain w/vomiting.  She's very tender.  Feels she may need to be seen this afternoon.    She would like to speak with you.    Phone #:  [phone].  Thank you.

## 2018-10-05 LAB — URINALYSIS, COMPLETE
Bacteria, UA: NONE SEEN /HPF
Bilirubin Urine: NEGATIVE
Glucose, UA: NEGATIVE
Hgb urine dipstick: NEGATIVE
Ketones, ur: NEGATIVE
Leukocytes, UA: NEGATIVE
Nitrite: NEGATIVE
RBC / HPF: NONE SEEN /HPF (ref 0–2)
Specific Gravity, Urine: 1.014 (ref 1.001–1.03)
pH: 6 (ref 5.0–8.0)

## 2018-10-05 LAB — URINE CULTURE
MICRO NUMBER: 91533128
Result:: NO GROWTH
SPECIMEN QUALITY:: ADEQUATE

## 2018-10-07 ENCOUNTER — Other Ambulatory Visit: Payer: Self-pay | Admitting: Internal Medicine

## 2018-10-07 ENCOUNTER — Ambulatory Visit: Payer: BLUE CROSS/BLUE SHIELD | Admitting: Internal Medicine

## 2018-10-07 ENCOUNTER — Telehealth: Payer: Self-pay | Admitting: Internal Medicine

## 2018-10-07 ENCOUNTER — Encounter: Payer: Self-pay | Admitting: Internal Medicine

## 2018-10-07 VITALS — BP 110/70 | HR 88 | Temp 98.1°F | Ht 59.5 in | Wt 93.0 lb

## 2018-10-07 DIAGNOSIS — B2799 Infectious mononucleosis, unspecified with other complication: Secondary | ICD-10-CM | POA: Diagnosis not present

## 2018-10-07 DIAGNOSIS — R109 Unspecified abdominal pain: Secondary | ICD-10-CM

## 2018-10-07 DIAGNOSIS — R110 Nausea: Secondary | ICD-10-CM | POA: Diagnosis not present

## 2018-10-07 DIAGNOSIS — B178 Other specified acute viral hepatitis: Secondary | ICD-10-CM

## 2018-10-07 DIAGNOSIS — R05 Cough: Secondary | ICD-10-CM

## 2018-10-07 DIAGNOSIS — R059 Cough, unspecified: Secondary | ICD-10-CM

## 2018-10-07 LAB — POCT URINALYSIS DIPSTICK
Appearance: NEGATIVE
Bilirubin, UA: 1
Blood, UA: NEGATIVE
Glucose, UA: NEGATIVE
Ketones, UA: NEGATIVE
LEUKOCYTES UA: NEGATIVE
Nitrite, UA: NEGATIVE
Odor: NEGATIVE
Protein, UA: NEGATIVE
SPEC GRAV UA: 1.01 (ref 1.010–1.025)
Urobilinogen, UA: 0.2 E.U./dL
pH, UA: 7.5 (ref 5.0–8.0)

## 2018-10-07 NOTE — Telephone Encounter (Signed)
Called mother with results. WBC elevated but lymphocytosis. Will repeat in am with CXR. LFTs improving.

## 2018-10-07 NOTE — Progress Notes (Signed)
   Subjective:    Patient ID: Shelby Watkins, female    DOB: [birth year], 22 y.o.   MRN: [id number]  HPI In today to follow-up on mononucleosis.  Her mother accompanies her today.  Patient appears less lethargic.  Says she is eating and drinking but mother has noted that she lost another pound.  Recent urine culture was negative.  She had liver functions checked on December 23 and alkaline phosphatase was 270, SGOT 648 and SGPT 968.  CBC same day was 11,700 with lymphocytosis.  Atypical lymphocytes were noted on peripheral smear.  BUN and creatinine were normal.  Sodium and potassium were normal.  Proteins in the blood were normal.  Glucose was 97.  CBC and C met were repeated today.  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  She thinks he will be a number of dogs today.  We will follow-up with her on December 30.  Her mother and father are going out of town beginning this coming Saturday for a week.  They are going to [place].  She lives in an apartment that is on their property.       Review of Systems denies nausea or vomiting.  Still has slight sore throat.  Last week was complaining of abdominal pain but no longer complaining of that     Objective:   Physical Exam She is afebrile.  Weight is 93 pounds.  Her affect remains flat.  Says she is tired.  Exudate resolving right tonsil.  Right tonsil less enlarged.  Neck supple.  Chest clear to auscultation.  Abdomen soft nondistended without hepatosplenomegaly masses or significant tenderness       Assessment & Plan:  Mononucleosis  Plan: Follow-up on December 30.  Have repeated CBC and C met today.  Encourage hydration and try to eat well.  Continue to rest.  Fortunately does not have to return to work until January 2.

## 2018-10-07 NOTE — Patient Instructions (Signed)
Encourage hydration.  Has Zofran for nausea.  Watch closely with complaint of abdominal pain.  Has hepatitis associated with mononucleosis.  Follow-up December 26.  Needs to remain out of work.

## 2018-10-07 NOTE — Patient Instructions (Signed)
Continue to rest.  Stay well-hydrated.  Try to eat well.  Follow-up on December 30.  Labs drawn and pending.

## 2018-10-08 ENCOUNTER — Encounter: Payer: Self-pay | Admitting: Internal Medicine

## 2018-10-08 ENCOUNTER — Ambulatory Visit
Admission: RE | Admit: 2018-10-08 | Discharge: 2018-10-08 | Disposition: A | Discharge status: Home / Self Care | Payer: BLUE CROSS/BLUE SHIELD | Source: Ambulatory Visit | Attending: Internal Medicine | Admitting: Internal Medicine

## 2018-10-08 ENCOUNTER — Other Ambulatory Visit: Payer: Self-pay | Admitting: Internal Medicine

## 2018-10-08 ENCOUNTER — Telehealth: Payer: Self-pay | Admitting: Internal Medicine

## 2018-10-08 ENCOUNTER — Other Ambulatory Visit: Payer: BLUE CROSS/BLUE SHIELD | Admitting: Internal Medicine

## 2018-10-08 DIAGNOSIS — B178 Other specified acute viral hepatitis: Secondary | ICD-10-CM

## 2018-10-08 DIAGNOSIS — B2799 Infectious mononucleosis, unspecified with other complication: Principal | ICD-10-CM

## 2018-10-08 DIAGNOSIS — R059 Cough, unspecified: Secondary | ICD-10-CM

## 2018-10-08 DIAGNOSIS — R05 Cough: Secondary | ICD-10-CM | POA: Diagnosis not present

## 2018-10-08 LAB — COMPLETE METABOLIC PANEL WITH GFR
AG Ratio: 1.3 (calc) (ref 1.0–2.5)
ALKALINE PHOSPHATASE (APISO): 235 U/L — AB (ref 33–115)
ALT: 480 U/L — ABNORMAL HIGH (ref 6–29)
AST: 190 U/L — ABNORMAL HIGH (ref 10–30)
Albumin: 4 g/dL (ref 3.6–5.1)
BILIRUBIN TOTAL: 0.6 mg/dL (ref 0.2–1.2)
BUN: 7 mg/dL (ref 7–25)
CO2: 28 mmol/L (ref 20–32)
Calcium: 9.5 mg/dL (ref 8.6–10.2)
Chloride: 100 mmol/L (ref 98–110)
Creat: 0.6 mg/dL (ref 0.50–1.10)
GFR, Est African American: 152 mL/min/{1.73_m2} (ref >=60)
GFR, Est Non African American: 131 mL/min/{1.73_m2} (ref >=60)
Globulin: 3.1 g/dL (calc) (ref 1.9–3.7)
Glucose, Bld: 89 mg/dL (ref 65–99)
Potassium: 4.2 mmol/L (ref 3.5–5.3)
Sodium: 138 mmol/L (ref 135–146)
Total Protein: 7.1 g/dL (ref 6.1–8.1)

## 2018-10-08 LAB — CBC WITH DIFFERENTIAL/PLATELET
Absolute Monocytes: 571 cells/uL (ref 200–950)
Basophils Absolute: 34 cells/uL (ref 0–200)
Basophils Relative: 0.2 %
Eosinophils Absolute: 34 cells/uL (ref 15–500)
Eosinophils Relative: 0.2 %
HEMATOCRIT: 38.7 % (ref 35.0–45.0)
HEMOGLOBIN: 12.9 g/dL (ref 11.7–15.5)
Lymphs Abs: 14330 cells/uL — ABNORMAL HIGH (ref 850–3900)
MCH: 29.3 pg (ref 27.0–33.0)
MCHC: 33.3 g/dL (ref 32.0–36.0)
MCV: 88 fL (ref 80.0–100.0)
MPV: 10.7 fL (ref 7.5–12.5)
Monocytes Relative: 3.4 %
Neutro Abs: 1831 cells/uL (ref 1500–7800)
Neutrophils Relative %: 10.9 %
Platelets: 293 10*3/uL (ref 140–400)
RBC: 4.4 10*6/uL (ref 3.80–5.10)
RDW: 12.8 % (ref 11.0–15.0)
Total Lymphocyte: 85.3 %
WBC: 16.8 10*3/uL — ABNORMAL HIGH (ref 3.8–10.8)

## 2018-10-08 NOTE — Telephone Encounter (Signed)
Results from today's lab work called and discussed with Dr. Leanor RubensteinHaber. Follow up Monday.

## 2018-10-09 NOTE — Patient Instructions (Signed)
Rest and drink plenty of fluids.  Zofran if needed for nausea.  Try doxycycline 100 mg twice daily for 10 days with food.  Addendum: Monospot test is positive-may not be necessary to be on doxycycline as this is likely acute viral mononucleosis with exudative pharyngitis.  Will follow closely

## 2018-10-11 ENCOUNTER — Ambulatory Visit: Payer: BLUE CROSS/BLUE SHIELD | Admitting: Internal Medicine

## 2018-10-11 ENCOUNTER — Encounter: Payer: Self-pay | Admitting: Internal Medicine

## 2018-10-11 VITALS — BP 90/70 | HR 78 | Temp 98.2°F | Ht 59.5 in | Wt 91.3 lb

## 2018-10-11 DIAGNOSIS — B2799 Infectious mononucleosis, unspecified with other complication: Secondary | ICD-10-CM

## 2018-10-11 DIAGNOSIS — B178 Other specified acute viral hepatitis: Secondary | ICD-10-CM | POA: Diagnosis not present

## 2018-10-11 LAB — COMPLETE METABOLIC PANEL WITH GFR
AG RATIO: 1.3 (calc) (ref 1.0–2.5)
ALT: 375 U/L — AB (ref 6–29)
AST: 122 U/L — ABNORMAL HIGH (ref 10–30)
Albumin: 4.3 g/dL (ref 3.6–5.1)
Alkaline phosphatase (APISO): 225 U/L — ABNORMAL HIGH (ref 33–115)
BUN: 8 mg/dL (ref 7–25)
CO2: 25 mmol/L (ref 20–32)
Calcium: 9.8 mg/dL (ref 8.6–10.2)
Chloride: 100 mmol/L (ref 98–110)
Creat: 0.6 mg/dL (ref 0.50–1.10)
GFR, Est African American: 152 mL/min/{1.73_m2} (ref >=60)
GFR, Est Non African American: 131 mL/min/{1.73_m2} (ref >=60)
Globulin: 3.2 g/dL (calc) (ref 1.9–3.7)
Glucose, Bld: 84 mg/dL (ref 65–99)
POTASSIUM: 4.4 mmol/L (ref 3.5–5.3)
Sodium: 136 mmol/L (ref 135–146)
Total Bilirubin: 0.5 mg/dL (ref 0.2–1.2)
Total Protein: 7.5 g/dL (ref 6.1–8.1)

## 2018-10-11 LAB — CBC WITH DIFFERENTIAL/PLATELET
Absolute Monocytes: 577 cells/uL (ref 200–950)
Basophils Absolute: 207 cells/uL — ABNORMAL HIGH (ref 0–200)
Basophils Relative: 1.4 %
Eosinophils Absolute: 44 cells/uL (ref 15–500)
Eosinophils Relative: 0.3 %
HCT: 38.1 % (ref 35.0–45.0)
Hemoglobin: 12.9 g/dL (ref 11.7–15.5)
Lymphs Abs: 12225 cells/uL — ABNORMAL HIGH (ref 850–3900)
MCH: 29.9 pg (ref 27.0–33.0)
MCHC: 33.9 g/dL (ref 32.0–36.0)
MCV: 88.4 fL (ref 80.0–100.0)
MONOS PCT: 3.9 %
MPV: 10.6 fL (ref 7.5–12.5)
Neutro Abs: 1746 cells/uL (ref 1500–7800)
Neutrophils Relative %: 11.8 %
PLATELETS: 299 10*3/uL (ref 140–400)
RBC: 4.31 10*6/uL (ref 3.80–5.10)
RDW: 12.8 % (ref 11.0–15.0)
Total Lymphocyte: 82.6 %
WBC: 14.8 10*3/uL — ABNORMAL HIGH (ref 3.8–10.8)

## 2018-10-11 LAB — EPSTEIN-BARR VIRUS VCA ANTIBODY PANEL
EBV NA IgG: <18 U/mL
EBV VCA IgG: 49.9 U/mL — ABNORMAL HIGH
EBV VCA IgM: >160 U/mL — ABNORMAL HIGH

## 2018-10-11 NOTE — Patient Instructions (Signed)
Continue to rest and drink plenty of fluids and follow-up on January 3.  Remain out of work

## 2018-10-11 NOTE — Progress Notes (Signed)
   Subjective:    Patient ID: Shelby Watkins, female    DOB: Jan 03, 1996, 22 y.o.   MRN: [id number]  HPI Mother is concerned that she is not improving very rapidly.  Feels that she is not eating and drinking very much.  Now weighs 91 pounds and was 93 pounds on December 26.  Remains out of work at my suggestion.  I do not think she should go to work later this week.  Liver functions remain elevated although improving.  Liver panel repeated today.  Epstein-Barr virus panel is back and shows current Epstein-Barr virus infection.  See results in epic.  Patient says she feels a little better.    Review of Systems see above     Objective:   Physical Exam Skin pale warm and dry.  Still has anterior cervical nodes bilaterally but they are smaller.  Right tonsil still enlarged but without exudate and seems to be getting smaller.  No hepatosplenomegaly masses or significant tenderness today.  TMs are clear.       Assessment & Plan:  Improving mononucleosis with hepatitis  Plan: Repeat liver functions today.  Note given to be out of work through January 3.  She might be able to return to work the following week but no heavy lifting more than 20 pounds.  She will follow-up here on January 3.

## 2018-10-12 LAB — HEPATIC FUNCTION PANEL
AG Ratio: 1.2 (calc) (ref 1.0–2.5)
ALT: 168 U/L — ABNORMAL HIGH (ref 6–29)
AST: 55 U/L — ABNORMAL HIGH (ref 10–30)
Albumin: 4 g/dL (ref 3.6–5.1)
Alkaline phosphatase (APISO): 176 U/L — ABNORMAL HIGH (ref 33–115)
BILIRUBIN DIRECT: 0.1 mg/dL (ref 0.0–0.2)
Globulin: 3.3 g/dL (calc) (ref 1.9–3.7)
Indirect Bilirubin: 0.4 mg/dL (calc) (ref 0.2–1.2)
Total Bilirubin: 0.5 mg/dL (ref 0.2–1.2)
Total Protein: 7.3 g/dL (ref 6.1–8.1)

## 2018-10-15 ENCOUNTER — Ambulatory Visit: Payer: BLUE CROSS/BLUE SHIELD | Admitting: Internal Medicine

## 2018-10-15 ENCOUNTER — Encounter: Payer: Self-pay | Admitting: Internal Medicine

## 2018-10-15 VITALS — Temp 98.4°F | Ht 59.5 in | Wt 94.0 lb

## 2018-10-15 DIAGNOSIS — B2799 Infectious mononucleosis, unspecified with other complication: Secondary | ICD-10-CM | POA: Diagnosis not present

## 2018-10-15 DIAGNOSIS — B178 Other specified acute viral hepatitis: Secondary | ICD-10-CM | POA: Diagnosis not present

## 2018-10-15 NOTE — Progress Notes (Signed)
   Subjective:    Patient ID: Shelby Watkins, female    DOB: Jul 23, 1996, 23 y.o.   MRN: [id number]  HPI 23 year old Female diagnosed with mononucleosis on December 19 has been slow to improve but finally is improving.  She had elevated liver functions we have been following closely.  She has had malaise and fatigue.  On December 30 alkaline phosphatase was 176 with an SGOT of 55 and SGPT of 168.  In late December alkaline phosphatase was 270 with SGOT of 648 and SGPT of 948.  She has been out of work resting at home.  She works as a Research scientist (medical).    Review of Systems see above     Objective:   Physical Exam  She is afebrile.  Pulse oximetry is 98%.  Weight is 94 pounds.  She has no scleral icterus.  No hepatosplenomegaly masses or tenderness.      Assessment & Plan:  Epstein-Barr virus infection improving  Elevated liver functions secondary to Epstein-Barr virus  Plan: She can try to work half days with no weight lifting more than 20 pounds.  Follow-up January 13.

## 2018-10-25 ENCOUNTER — Encounter: Payer: Self-pay | Admitting: Internal Medicine

## 2018-10-25 ENCOUNTER — Ambulatory Visit (INDEPENDENT_AMBULATORY_CARE_PROVIDER_SITE_OTHER): Payer: BLUE CROSS/BLUE SHIELD | Admitting: Internal Medicine

## 2018-10-25 VITALS — BP 100/60 | HR 86 | Temp 98.6°F | Ht 59.5 in | Wt 95.0 lb

## 2018-10-25 DIAGNOSIS — R7989 Other specified abnormal findings of blood chemistry: Secondary | ICD-10-CM

## 2018-10-25 DIAGNOSIS — R059 Cough, unspecified: Secondary | ICD-10-CM

## 2018-10-25 DIAGNOSIS — B2799 Infectious mononucleosis, unspecified with other complication: Secondary | ICD-10-CM

## 2018-10-25 DIAGNOSIS — B178 Other specified acute viral hepatitis: Secondary | ICD-10-CM | POA: Diagnosis not present

## 2018-10-25 DIAGNOSIS — R945 Abnormal results of liver function studies: Secondary | ICD-10-CM

## 2018-10-25 DIAGNOSIS — R05 Cough: Secondary | ICD-10-CM

## 2018-10-26 LAB — HEPATIC FUNCTION PANEL
AG Ratio: 1.6 (calc) (ref 1.0–2.5)
ALT: 32 U/L — AB (ref 6–29)
AST: 33 U/L — ABNORMAL HIGH (ref 10–30)
Albumin: 4.3 g/dL (ref 3.6–5.1)
Alkaline phosphatase (APISO): 93 U/L (ref 33–115)
Bilirubin, Direct: 0.1 mg/dL (ref 0.0–0.2)
Globulin: 2.7 g/dL (calc) (ref 1.9–3.7)
Indirect Bilirubin: 0.4 mg/dL (calc) (ref 0.2–1.2)
TOTAL PROTEIN: 7 g/dL (ref 6.1–8.1)
Total Bilirubin: 0.5 mg/dL (ref 0.2–1.2)

## 2018-11-01 ENCOUNTER — Ambulatory Visit: Payer: BLUE CROSS/BLUE SHIELD | Admitting: Internal Medicine

## 2018-11-06 NOTE — Patient Instructions (Signed)
May return to work half days with no heavy lifting more than 20 pounds.  Follow-up January 13.

## 2018-11-06 NOTE — Patient Instructions (Addendum)
Monitor cough.  May return to full-time work soon.  Elevated liver functions have improved significantly and are near normal.

## 2018-11-06 NOTE — Progress Notes (Signed)
   Subjective:    Patient ID: Shelby Watkins, female    DOB: 02-08-1996, 23 y.o.   MRN: [id number]  HPI 23 year old Female with protracted Epstein-Barr virus course since mid December.  Now feeling better.  Has been working half days with weight lifting restricted to 20 pounds.  Had elevated liver functions with mononucleosis.  These have significantly improved and are at or near normal.  Alkaline phosphatase is normal and SGOT is 33 with an SGPT of 32.    Review of Systems complaint of cough but no fever or chills     Objective:   Physical Exam Blood pressure 100/60, temperature 98.6 degrees orally pulse oximetry 99% weight 95 pounds.  Skin pale warm and dry.  Nodes none.  Neck is supple.  Chest clear to auscultation without rales or wheezing.  Pharynx and TMs are clear.  Abdomen is soft nondistended without hepatosplenomegaly masses or tenderness       Assessment & Plan:  Resolving mononucleosis associated with hepatitis now with near normal liver function test.  Will not repeat.  Cough-etiology unclear-may be coming down with a respiratory infection continue to monitor  Plan: Patient will soon be able to return to full-time work.  Restrictions noted with patient and her mother.

## 2018-11-17 DIAGNOSIS — Z23 Encounter for immunization: Secondary | ICD-10-CM | POA: Diagnosis not present

## 2018-11-17 DIAGNOSIS — L700 Acne vulgaris: Secondary | ICD-10-CM | POA: Diagnosis not present

## 2018-12-31 ENCOUNTER — Ambulatory Visit: Payer: BLUE CROSS/BLUE SHIELD | Admitting: Internal Medicine

## 2019-01-20 ENCOUNTER — Telehealth: Payer: Self-pay | Admitting: Internal Medicine

## 2019-01-20 NOTE — Telephone Encounter (Signed)
Spoke with mom Shelby Watkins) ask her to have [name] call office to reschedule appt. **Need Designated party Release form signed so that we may speak to mom)**

## 2019-07-25 ENCOUNTER — Ambulatory Visit (INDEPENDENT_AMBULATORY_CARE_PROVIDER_SITE_OTHER): Payer: BC Managed Care – PPO | Admitting: Internal Medicine

## 2019-07-25 ENCOUNTER — Other Ambulatory Visit: Payer: Self-pay

## 2019-07-25 DIAGNOSIS — Z23 Encounter for immunization: Secondary | ICD-10-CM

## 2019-07-25 NOTE — Progress Notes (Signed)
Flu vaccine per CMA 

## 2019-07-25 NOTE — Patient Instructions (Signed)
Patient received a flu vaccine IM L deltoid, AV, CMA  

## 2020-01-27 ENCOUNTER — Encounter: Payer: Self-pay | Admitting: Orthopaedic Surgery

## 2020-01-27 ENCOUNTER — Other Ambulatory Visit: Payer: Self-pay

## 2020-01-27 ENCOUNTER — Ambulatory Visit: Payer: BC Managed Care – PPO | Admitting: Orthopaedic Surgery

## 2020-01-27 ENCOUNTER — Ambulatory Visit: Payer: Self-pay

## 2020-01-27 ENCOUNTER — Telehealth: Payer: Self-pay | Admitting: Radiology

## 2020-01-27 VITALS — Ht 58.75 in

## 2020-01-27 DIAGNOSIS — M25511 Pain in right shoulder: Secondary | ICD-10-CM | POA: Diagnosis not present

## 2020-01-27 NOTE — Progress Notes (Signed)
   Office Visit Note   Patient: Shelby Watkins           Date of Birth: 1995/10/18           MRN: [id number] Visit Date: 01/27/2020              Requested by: Margaree Mackintosh, MD [address] [place],  [place] [id number] PCP: Margaree Mackintosh, MD   Assessment & Plan: Visit Diagnoses:  1. Acute pain of right shoulder     Plan: Impression is right pec major strain at the clavicular attachment.  Shoulder exam is grossly normal.  We have recommended relative rest, ice, heat and NSAIDs.  We will provide her with a work note today.  She will follow-up with [place] as needed.  Follow-Up Instructions: Return if symptoms worsen or fail to improve.   Orders:  Orders Placed This Encounter  Procedures  . XR Shoulder Right   No orders of the defined types were placed in this encounter.     Procedures: No procedures performed   Clinical Data: No additional findings.   Subjective: Chief Complaint  Patient presents with  . 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  She notes that she had to aggressively push a dog forward when soon after she noticed pain to the anterior shoulder and distal clavicle.  This pain has been somewhat constant with mild weakness the right upper extremity.  Her pain does appear to be worse with any forceful pushing of the right upper extremity as well as with internal rotation and forward flexion.  She has tried CBD oil and Tylenol with out significant relief.  She denies any numbness, tingling or burning.  No previous injection or surgical intervention.  Review of Systems as detailed in HPI.  All others reviewed and are negative.   Objective: Vital Signs: Ht 4' 10.75" (1.492 m)   BMI 19.35 kg/m   Physical Exam well-developed well-nourished female no acute distress.  Alert and oriented x3.  Ortho Exam examination of the right shoulder reveals  moderate tenderness along the pec and clavicle attachment.  The pain to this area is aggravated with forward flexion and internal rotation of the shoulder and empty can.   Subscap testing is normal.  She is neurovascularly intact distally.  Specialty Comments:  No specialty comments available.  Imaging: XR Shoulder Right  Result Date: 01/27/2020 No acute or structural abnormalities    PMFS History: Patient Active Problem List   Diagnosis Date Noted  . Attention deficit disorder 09/10/2018   Past Medical History:  Diagnosis Date  . ADHD (attention deficit hyperactivity disorder)     Family History  Adopted: Yes  Family history unknown: Yes    History reviewed. No pertinent surgical history. Social History   Occupational History  . Not on file  Tobacco Use  . Smoking status: Never Smoker  . Smokeless tobacco: Never Used  Substance and Sexual Activity  . Alcohol use: Not on file  . Drug use: Not on file  . Sexual activity: Not on file

## 2020-01-27 NOTE — Telephone Encounter (Signed)
Patient mother called in triage this morning.  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 Stated patient is unable to lift right arm this morning. Stated has been previous Dr. Cleophas Dunker patient in the past.

## 2020-01-27 NOTE — Telephone Encounter (Signed)
Patient was seen in our office today.  

## 2020-03-19 ENCOUNTER — Ambulatory Visit (INDEPENDENT_AMBULATORY_CARE_PROVIDER_SITE_OTHER): Payer: BC Managed Care – PPO | Admitting: Internal Medicine

## 2020-03-19 ENCOUNTER — Encounter: Payer: Self-pay | Admitting: Internal Medicine

## 2020-03-19 ENCOUNTER — Other Ambulatory Visit: Payer: Self-pay

## 2020-03-19 VITALS — BP 100/60 | HR 71 | Wt 100.0 lb

## 2020-03-19 DIAGNOSIS — Z8619 Personal history of other infectious and parasitic diseases: Secondary | ICD-10-CM | POA: Diagnosis not present

## 2020-03-19 DIAGNOSIS — Z8659 Personal history of other mental and behavioral disorders: Secondary | ICD-10-CM

## 2020-03-19 DIAGNOSIS — F909 Attention-deficit hyperactivity disorder, unspecified type: Secondary | ICD-10-CM

## 2020-03-19 LAB — POCT URINALYSIS DIPSTICK
Appearance: NEGATIVE
Bilirubin, UA: NEGATIVE
Glucose, UA: NEGATIVE
Ketones, UA: NEGATIVE
Leukocytes, UA: NEGATIVE
Nitrite, UA: NEGATIVE
Odor: NEGATIVE
Protein, UA: NEGATIVE
Spec Grav, UA: 1.015 (ref 1.010–1.025)
Urobilinogen, UA: 0.2 E.U./dL
pH, UA: 7.5 (ref 5.0–8.0)

## 2020-03-19 MED ORDER — ESCITALOPRAM OXALATE 10 MG PO TABS: 10.0000 mg | ORAL_TABLET | Freq: Every day | ORAL | 2 refills | Status: AC

## 2020-03-19 NOTE — Progress Notes (Signed)
° °  Subjective:    Patient ID: Shelby Watkins, female    DOB: [birth year], 24 y.o.   MRN: [id number]  HPI 24 year old Female for follow up on anxiety.  January 2020 she had a severe case of infectious mononucleosis but recovered.  Patient has been working as a Air traffic controller.  Has done well during the pandemic.  Lives on property adjacent to her parents home.  She came to this country from [place] in March 2000 and approximately 24 year of age and was adopted by Dr. Anthoney Harada and her husband.  History of attention deficit issues treated with Vyvanse but apparently currently not taking this.  Has GYN physician.  She is sexually active and has boyfriend.  No history of serious illnesses accidents or operations.  Family history unknown, she was adopted from [place].  She does smoke marijuana sometimes.  History of vitamin D deficiency diagnosed in 2019.  Dr. Dillard Essex asked that we see her today for follow-up regarding anxiety and renewal of Lexapro.  This seems to treat both anxiety and her mood.  This is the first time we have prescribed this for her by my records.    Review of Systems see above     Objective:   Physical Exam  Vital signs reviewed.  Blood pressure 100/60, pulse 71, pulse oximetry 99%, weight 100 pounds BMI 20.37  Pleasant and cooperative.  Skin warm and dry.  No cervical adenopathy.  No thyromegaly.  Chest clear to auscultation.  Cardiac exam regular rate and rhythm normal S1 and S2.  Affect thought and judgment are within normal limits.  GYN exam not done.    Assessment & Plan:  History of anxiety disorder  History of attention deficit disorder remote history of mononucleosis late December 2019 and 2020-resolved after protracted course with elevated liver functions in the 600-900 range initially  History of attention deficit disorder previously treated with Vyvanse  Health maintenance exam-tetanus immunization is up-to-date and she has had 2 COVID-19  immunizations  Plan: Prescription for Lexapro 10 mg daily.  Return in 1 year or as needed.  Continue to see GYN physician.  It is my feeling that she does have attention deficit issues as well as anxiety disorder exacerbated by  situational stress.  Has been to counseling.  Sometimes has issues with interpersonal interactions which can impact her job situation.  Urine dipstick has large occult blood and patient indicates she is on menses today.  CBC with differential is normal.  C-Met is normal.  This is the first visit since her protracted course with mono and elevated liver functions I wanted to check her liver functions today.

## 2020-03-20 LAB — COMPLETE METABOLIC PANEL WITH GFR
AG Ratio: 1.8 (calc) (ref 1.0–2.5)
ALT: 7 U/L (ref 6–29)
AST: 13 U/L (ref 10–30)
Albumin: 4.6 g/dL (ref 3.6–5.1)
Alkaline phosphatase (APISO): 52 U/L (ref 31–125)
BUN: 8 mg/dL (ref 7–25)
CO2: 26 mmol/L (ref 20–32)
Calcium: 9.6 mg/dL (ref 8.6–10.2)
Chloride: 103 mmol/L (ref 98–110)
Creat: 0.6 mg/dL (ref 0.50–1.10)
GFR, Est African American: 150 mL/min/{1.73_m2} (ref >=60)
GFR, Est Non African American: 129 mL/min/{1.73_m2} (ref >=60)
Globulin: 2.6 g/dL (calc) (ref 1.9–3.7)
Glucose, Bld: 88 mg/dL (ref 65–99)
Potassium: 3.9 mmol/L (ref 3.5–5.3)
Sodium: 136 mmol/L (ref 135–146)
Total Bilirubin: 0.5 mg/dL (ref 0.2–1.2)
Total Protein: 7.2 g/dL (ref 6.1–8.1)

## 2020-03-20 LAB — CBC WITH DIFFERENTIAL/PLATELET
Absolute Monocytes: 448 cells/uL (ref 200–950)
Basophils Absolute: 21 cells/uL (ref 0–200)
Basophils Relative: 0.3 %
Eosinophils Absolute: 182 cells/uL (ref 15–500)
Eosinophils Relative: 2.6 %
HCT: 37.3 % (ref 35.0–45.0)
Hemoglobin: 12.4 g/dL (ref 11.7–15.5)
Lymphs Abs: 1785 cells/uL (ref 850–3900)
MCH: 30.1 pg (ref 27.0–33.0)
MCHC: 33.2 g/dL (ref 32.0–36.0)
MCV: 90.5 fL (ref 80.0–100.0)
MPV: 10.6 fL (ref 7.5–12.5)
Monocytes Relative: 6.4 %
Neutro Abs: 4564 cells/uL (ref 1500–7800)
Neutrophils Relative %: 65.2 %
Platelets: 296 10*3/uL (ref 140–400)
RBC: 4.12 10*6/uL (ref 3.80–5.10)
RDW: 12.4 % (ref 11.0–15.0)
Total Lymphocyte: 25.5 %
WBC: 7 10*3/uL (ref 3.8–10.8)

## 2020-03-20 LAB — MONONUCLEOSIS SCREEN: Heterophile, Mono Screen: NEGATIVE

## 2020-04-08 NOTE — Patient Instructions (Addendum)
Prescribe Lexapro 10 mg daily for anxiety.  Could not do lipid panel as patient not fasting today.  CBC and c-Met within normal limits.

## 2020-08-14 DIAGNOSIS — Z01419 Encounter for gynecological examination (general) (routine) without abnormal findings: Secondary | ICD-10-CM | POA: Diagnosis not present

## 2020-08-14 DIAGNOSIS — Z681 Body mass index (BMI) 19 or less, adult: Secondary | ICD-10-CM | POA: Diagnosis not present

## 2020-08-14 DIAGNOSIS — Z113 Encounter for screening for infections with a predominantly sexual mode of transmission: Secondary | ICD-10-CM | POA: Diagnosis not present

## 2020-08-29 DIAGNOSIS — Z3202 Encounter for pregnancy test, result negative: Secondary | ICD-10-CM | POA: Diagnosis not present

## 2020-08-29 DIAGNOSIS — Z3046 Encounter for surveillance of implantable subdermal contraceptive: Secondary | ICD-10-CM | POA: Diagnosis not present

## 2021-05-30 ENCOUNTER — Telehealth: Payer: BC Managed Care – PPO | Admitting: Internal Medicine

## 2021-05-30 NOTE — Telephone Encounter (Signed)
Shelby Watkins [phone]  Shelby Watkins called to say she fell on Tuesday night and hit her head on the bathtub, not sure if she passed out. When she opened her eyes she was on the floor and her head hurt, she went to the bathroom because she felt like she was nauseas and going to be sick. Just a few minutes ago she was grooming a dog and she felt wobble and was shaking. Thinks she might have a concussion.

## 2021-05-30 NOTE — Telephone Encounter (Signed)
Called patient back to let her know she needs to go to ED to be evaluated and she told me she would and she also said she had done some magic mushrooms and that may be what's messing with her also.

## 2021-10-25 ENCOUNTER — Encounter (HOSPITAL_BASED_OUTPATIENT_CLINIC_OR_DEPARTMENT_OTHER): Payer: Self-pay | Admitting: Emergency Medicine

## 2021-10-25 ENCOUNTER — Emergency Department (HOSPITAL_BASED_OUTPATIENT_CLINIC_OR_DEPARTMENT_OTHER)
Admission: EM | Admit: 2021-10-25 | Discharge: 2021-10-25 | Disposition: A | Discharge status: Home / Self Care | Payer: No Typology Code available for payment source | Attending: Emergency Medicine | Admitting: Emergency Medicine

## 2021-10-25 ENCOUNTER — Emergency Department (HOSPITAL_BASED_OUTPATIENT_CLINIC_OR_DEPARTMENT_OTHER): Payer: No Typology Code available for payment source | Admitting: Radiology

## 2021-10-25 DIAGNOSIS — G8911 Acute pain due to trauma: Secondary | ICD-10-CM | POA: Diagnosis not present

## 2021-10-25 DIAGNOSIS — M79642 Pain in left hand: Secondary | ICD-10-CM | POA: Diagnosis not present

## 2021-10-25 DIAGNOSIS — M25511 Pain in right shoulder: Secondary | ICD-10-CM | POA: Diagnosis present

## 2021-10-25 MED ORDER — ACETAMINOPHEN 325 MG PO TABS
650.0000 mg | ORAL_TABLET | Freq: Once | ORAL | Status: AC
  Administered 2021-10-25: 650 mg via ORAL
  Filled 2021-10-25: qty 2

## 2021-10-25 NOTE — Discharge Instructions (Signed)
Call your primary care doctor or specialist as discussed in the next 2-3 days.   Return immediately back to the ER if:  Your symptoms worsen within the next 12-24 hours. You develop new symptoms such as new fevers, persistent vomiting, new pain, shortness of breath, or new weakness or numbness, or if you have any other concerns.  

## 2021-10-25 NOTE — ED Notes (Addendum)
Patient transported to X-ray 
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
